# Patient Record
Sex: Male | Born: 1980 | Hispanic: Yes | Marital: Married | Smoking: Former smoker
Health system: Southern US, Community
[De-identification: ages and names within clinical notes are randomized; demographics above are authoritative.]

## PROBLEM LIST (undated history)

## (undated) DIAGNOSIS — E785 Hyperlipidemia, unspecified: Secondary | ICD-10-CM

## (undated) DIAGNOSIS — R011 Cardiac murmur, unspecified: Secondary | ICD-10-CM

## (undated) HISTORY — PX: APPENDECTOMY (OPEN): SHX54

## (undated) HISTORY — DX: Hyperlipidemia, unspecified: E78.5

## (undated) HISTORY — DX: Cardiac murmur, unspecified: R01.1

---

## 2013-05-03 ENCOUNTER — Inpatient Hospital Stay (HOSPITAL_COMMUNITY): Payer: Federal, State, Local not specified - PPO | Admitting: Anesthesiology

## 2013-05-03 ENCOUNTER — Emergency Department (HOSPITAL_COMMUNITY): Payer: Federal, State, Local not specified - PPO

## 2013-05-03 ENCOUNTER — Encounter (HOSPITAL_COMMUNITY): Payer: Self-pay | Admitting: Emergency Medicine

## 2013-05-03 ENCOUNTER — Encounter (HOSPITAL_COMMUNITY): Payer: Federal, State, Local not specified - PPO | Admitting: Anesthesiology

## 2013-05-03 ENCOUNTER — Inpatient Hospital Stay (HOSPITAL_COMMUNITY)
Admission: EM | Admit: 2013-05-03 | Discharge: 2013-05-05 | DRG: 343 | Disposition: A | Discharge status: Home / Self Care | Payer: Federal, State, Local not specified - PPO | Attending: General Surgery | Admitting: General Surgery

## 2013-05-03 ENCOUNTER — Encounter (HOSPITAL_COMMUNITY): Admission: EM | Disposition: A | Discharge status: Home / Self Care | Payer: Self-pay | Source: Home / Self Care

## 2013-05-03 DIAGNOSIS — K3580 Unspecified acute appendicitis: Secondary | ICD-10-CM

## 2013-05-03 DIAGNOSIS — K37 Unspecified appendicitis: Secondary | ICD-10-CM

## 2013-05-03 DIAGNOSIS — Z87891 Personal history of nicotine dependence: Secondary | ICD-10-CM

## 2013-05-03 HISTORY — PX: LAPAROSCOPIC APPENDECTOMY: SHX408

## 2013-05-03 LAB — URINALYSIS, ROUTINE W REFLEX MICROSCOPIC
BILIRUBIN URINE: NEGATIVE
Glucose, UA: NEGATIVE mg/dL
HGB URINE DIPSTICK: NEGATIVE
Ketones, ur: NEGATIVE mg/dL
Leukocytes, UA: NEGATIVE
Nitrite: NEGATIVE
Protein, ur: 30 mg/dL — AB
Specific Gravity, Urine: 1.028 (ref 1.005–1.030)
UROBILINOGEN UA: 0.2 mg/dL (ref 0.0–1.0)
pH: 8.5 — ABNORMAL HIGH (ref 5.0–8.0)

## 2013-05-03 LAB — COMPREHENSIVE METABOLIC PANEL
ALK PHOS: 63 U/L (ref 39–117)
ALT: 25 U/L (ref 0–53)
AST: 27 U/L (ref 0–37)
Albumin: 4.6 g/dL (ref 3.5–5.2)
BILIRUBIN TOTAL: 2.6 mg/dL — AB (ref 0.3–1.2)
BUN: 12 mg/dL (ref 6–23)
CHLORIDE: 100 meq/L (ref 96–112)
CO2: 25 meq/L (ref 19–32)
Calcium: 9.9 mg/dL (ref 8.4–10.5)
Creatinine, Ser: 0.77 mg/dL (ref 0.50–1.35)
GFR calc non Af Amer: >90 mL/min (ref >90)
GLUCOSE: 134 mg/dL — AB (ref 70–99)
Potassium: 3.8 mEq/L (ref 3.7–5.3)
Sodium: 139 mEq/L (ref 137–147)
Total Protein: 7.4 g/dL (ref 6.0–8.3)

## 2013-05-03 LAB — CBC WITH DIFFERENTIAL/PLATELET
Basophils Absolute: 0 10*3/uL (ref 0.0–0.1)
Basophils Relative: 0 % (ref 0–1)
EOS PCT: 0 % (ref 0–5)
Eosinophils Absolute: 0 10*3/uL (ref 0.0–0.7)
HCT: 45.1 % (ref 39.0–52.0)
Hemoglobin: 16.6 g/dL (ref 13.0–17.0)
LYMPHS ABS: 0.5 10*3/uL — AB (ref 0.7–4.0)
LYMPHS PCT: 3 % — AB (ref 12–46)
MCH: 31.3 pg (ref 26.0–34.0)
MCHC: 36.8 g/dL — ABNORMAL HIGH (ref 30.0–36.0)
MCV: 85.1 fL (ref 78.0–100.0)
Monocytes Absolute: 1.1 10*3/uL — ABNORMAL HIGH (ref 0.1–1.0)
Monocytes Relative: 6 % (ref 3–12)
NEUTROS PCT: 91 % — AB (ref 43–77)
Neutro Abs: 15.7 10*3/uL — ABNORMAL HIGH (ref 1.7–7.7)
Platelets: 203 10*3/uL (ref 150–400)
RBC: 5.3 MIL/uL (ref 4.22–5.81)
RDW: 12.1 % (ref 11.5–15.5)
WBC: 17.4 10*3/uL — AB (ref 4.0–10.5)

## 2013-05-03 LAB — LIPASE, BLOOD: Lipase: 25 U/L (ref 11–59)

## 2013-05-03 LAB — URINE MICROSCOPIC-ADD ON

## 2013-05-03 SURGERY — APPENDECTOMY, LAPAROSCOPIC
Anesthesia: General | Site: Abdomen

## 2013-05-03 MED ORDER — ONDANSETRON HCL 4 MG/2ML IJ SOLN: 4.0000 mg | Freq: Four times a day (QID) | INTRAMUSCULAR | Status: DC | PRN

## 2013-05-03 MED ORDER — FENTANYL CITRATE 0.05 MG/ML IJ SOLN
INTRAMUSCULAR | Status: AC
  Filled 2013-05-03: qty 5

## 2013-05-03 MED ORDER — HYDROMORPHONE HCL PF 1 MG/ML IJ SOLN: 0.2500 mg | INTRAMUSCULAR | Status: DC | PRN

## 2013-05-03 MED ORDER — ARTIFICIAL TEARS OP OINT
TOPICAL_OINTMENT | OPHTHALMIC | Status: DC | PRN
  Administered 2013-05-03: 1 via OPHTHALMIC

## 2013-05-03 MED ORDER — ROCURONIUM BROMIDE 50 MG/5ML IV SOLN
INTRAVENOUS | Status: AC
  Filled 2013-05-03: qty 1

## 2013-05-03 MED ORDER — SUCCINYLCHOLINE CHLORIDE 20 MG/ML IJ SOLN
INTRAMUSCULAR | Status: DC | PRN
Start: 2013-05-03 — End: 2013-05-03
  Administered 2013-05-03: 100 mg via INTRAVENOUS

## 2013-05-03 MED ORDER — KCL IN DEXTROSE-NACL 20-5-0.45 MEQ/L-%-% IV SOLN: INTRAVENOUS | Status: DC

## 2013-05-03 MED ORDER — PIPERACILLIN-TAZOBACTAM 3.375 G IVPB
3.3750 g | Freq: Three times a day (TID) | INTRAVENOUS | Status: AC
  Administered 2013-05-03 – 2013-05-04 (×2): 3.375 g via INTRAVENOUS
  Filled 2013-05-03 (×2): qty 50

## 2013-05-03 MED ORDER — PROPOFOL 10 MG/ML IV BOLUS
INTRAVENOUS | Status: AC
  Filled 2013-05-03: qty 20

## 2013-05-03 MED ORDER — PIPERACILLIN-TAZOBACTAM 3.375 G IVPB 30 MIN
3.3750 g | Freq: Once | INTRAVENOUS | Status: AC
  Administered 2013-05-03: 3.375 g via INTRAVENOUS
  Filled 2013-05-03: qty 50

## 2013-05-03 MED ORDER — FENTANYL CITRATE 0.05 MG/ML IJ SOLN
INTRAMUSCULAR | Status: DC | PRN
  Administered 2013-05-03: 50 ug via INTRAVENOUS
  Administered 2013-05-03: 100 ug via INTRAVENOUS
  Administered 2013-05-03 (×2): 50 ug via INTRAVENOUS

## 2013-05-03 MED ORDER — INFLUENZA VAC SPLIT QUAD 0.5 ML IM SUSP: 0.5000 mL | INTRAMUSCULAR | Status: DC | PRN

## 2013-05-03 MED ORDER — NEOSTIGMINE METHYLSULFATE 1 MG/ML IJ SOLN
INTRAMUSCULAR | Status: DC | PRN
  Administered 2013-05-03: 3 mg via INTRAVENOUS

## 2013-05-03 MED ORDER — MORPHINE SULFATE 4 MG/ML IJ SOLN
4.0000 mg | Freq: Once | INTRAMUSCULAR | Status: AC
  Administered 2013-05-03: 4 mg via INTRAVENOUS
  Filled 2013-05-03: qty 1

## 2013-05-03 MED ORDER — SODIUM CHLORIDE 0.9 % IR SOLN
Status: DC | PRN
Start: 2013-05-03 — End: 2013-05-03
  Administered 2013-05-03: 1000 mL

## 2013-05-03 MED ORDER — MORPHINE SULFATE 2 MG/ML IJ SOLN
2.0000 mg | INTRAMUSCULAR | Status: DC | PRN
  Administered 2013-05-04: 2 mg via INTRAVENOUS
  Filled 2013-05-03: qty 1

## 2013-05-03 MED ORDER — OXYCODONE HCL 5 MG/5ML PO SOLN: 5.0000 mg | Freq: Once | ORAL | Status: DC | PRN

## 2013-05-03 MED ORDER — KCL IN DEXTROSE-NACL 20-5-0.45 MEQ/L-%-% IV SOLN
INTRAVENOUS | Status: AC
  Filled 2013-05-03: qty 1000

## 2013-05-03 MED ORDER — DIPHENHYDRAMINE HCL 12.5 MG/5ML PO ELIX: 12.5000 mg | ORAL_SOLUTION | Freq: Four times a day (QID) | ORAL | Status: DC | PRN

## 2013-05-03 MED ORDER — LACTATED RINGERS IV SOLN
INTRAVENOUS | Status: DC
  Administered 2013-05-03: 17:00:00 via INTRAVENOUS

## 2013-05-03 MED ORDER — LACTATED RINGERS IV SOLN
INTRAVENOUS | Status: DC | PRN
  Administered 2013-05-03 (×2): via INTRAVENOUS

## 2013-05-03 MED ORDER — MIDAZOLAM HCL 2 MG/2ML IJ SOLN
INTRAMUSCULAR | Status: AC
  Filled 2013-05-03: qty 2

## 2013-05-03 MED ORDER — ONDANSETRON HCL 4 MG/2ML IJ SOLN
4.0000 mg | Freq: Once | INTRAMUSCULAR | Status: AC
  Administered 2013-05-03: 4 mg via INTRAVENOUS
  Filled 2013-05-03: qty 2

## 2013-05-03 MED ORDER — ARTIFICIAL TEARS OP OINT
TOPICAL_OINTMENT | OPHTHALMIC | Status: AC
  Filled 2013-05-03: qty 3.5

## 2013-05-03 MED ORDER — PROPOFOL 10 MG/ML IV BOLUS
INTRAVENOUS | Status: DC | PRN
  Administered 2013-05-03: 200 mg via INTRAVENOUS

## 2013-05-03 MED ORDER — BUPIVACAINE-EPINEPHRINE (PF) 0.5% -1:200000 IJ SOLN
INTRAMUSCULAR | Status: AC
  Filled 2013-05-03: qty 10

## 2013-05-03 MED ORDER — NEOSTIGMINE METHYLSULFATE 1 MG/ML IJ SOLN
INTRAMUSCULAR | Status: AC
  Filled 2013-05-03: qty 10

## 2013-05-03 MED ORDER — MORPHINE SULFATE 2 MG/ML IJ SOLN: 2.0000 mg | INTRAMUSCULAR | Status: DC | PRN

## 2013-05-03 MED ORDER — LIDOCAINE HCL (CARDIAC) 20 MG/ML IV SOLN
INTRAVENOUS | Status: DC | PRN
  Administered 2013-05-03: 80 mg via INTRAVENOUS

## 2013-05-03 MED ORDER — ONDANSETRON HCL 4 MG/2ML IJ SOLN
INTRAMUSCULAR | Status: DC | PRN
  Administered 2013-05-03: 4 mg via INTRAVENOUS

## 2013-05-03 MED ORDER — KCL IN DEXTROSE-NACL 20-5-0.45 MEQ/L-%-% IV SOLN
INTRAVENOUS | Status: DC
  Administered 2013-05-03: 22:00:00 via INTRAVENOUS
  Administered 2013-05-03: 100 mL via INTRAVENOUS
  Filled 2013-05-03 (×3): qty 1000

## 2013-05-03 MED ORDER — ROCURONIUM BROMIDE 100 MG/10ML IV SOLN
INTRAVENOUS | Status: DC | PRN
  Administered 2013-05-03: 50 mg via INTRAVENOUS

## 2013-05-03 MED ORDER — IOHEXOL 300 MG/ML  SOLN
100.0000 mL | Freq: Once | INTRAMUSCULAR | Status: AC | PRN
  Administered 2013-05-03: 100 mL via INTRAVENOUS

## 2013-05-03 MED ORDER — PIPERACILLIN-TAZOBACTAM 3.375 G IVPB
3.3750 g | Freq: Three times a day (TID) | INTRAVENOUS | Status: DC
  Filled 2013-05-03 (×2): qty 50

## 2013-05-03 MED ORDER — LIDOCAINE HCL (CARDIAC) 20 MG/ML IV SOLN
INTRAVENOUS | Status: AC
  Filled 2013-05-03: qty 5

## 2013-05-03 MED ORDER — OXYCODONE-ACETAMINOPHEN 5-325 MG PO TABS
1.0000 | ORAL_TABLET | ORAL | Status: DC | PRN
  Administered 2013-05-04 – 2013-05-05 (×3): 2 via ORAL
  Filled 2013-05-03 (×3): qty 2

## 2013-05-03 MED ORDER — IOHEXOL 300 MG/ML  SOLN
25.0000 mL | INTRAMUSCULAR | Status: DC | PRN
  Administered 2013-05-03: 25 mL via ORAL

## 2013-05-03 MED ORDER — GLYCOPYRROLATE 0.2 MG/ML IJ SOLN
INTRAMUSCULAR | Status: DC | PRN
  Administered 2013-05-03: 0.4 mg via INTRAVENOUS

## 2013-05-03 MED ORDER — MIDAZOLAM HCL 5 MG/5ML IJ SOLN
INTRAMUSCULAR | Status: DC | PRN
  Administered 2013-05-03: 2 mg via INTRAVENOUS

## 2013-05-03 MED ORDER — GLYCOPYRROLATE 0.2 MG/ML IJ SOLN
INTRAMUSCULAR | Status: AC
  Filled 2013-05-03: qty 3

## 2013-05-03 MED ORDER — OXYCODONE HCL 5 MG PO TABS: 5.0000 mg | ORAL_TABLET | Freq: Once | ORAL | Status: DC | PRN

## 2013-05-03 MED ORDER — BUPIVACAINE-EPINEPHRINE 0.5% -1:200000 IJ SOLN
INTRAMUSCULAR | Status: DC | PRN
  Administered 2013-05-03: 21 mL

## 2013-05-03 MED ORDER — ONDANSETRON HCL 4 MG/2ML IJ SOLN
INTRAMUSCULAR | Status: AC
  Filled 2013-05-03: qty 2

## 2013-05-03 MED ORDER — DIPHENHYDRAMINE HCL 50 MG/ML IJ SOLN: 12.5000 mg | Freq: Four times a day (QID) | INTRAMUSCULAR | Status: DC | PRN

## 2013-05-03 SURGICAL SUPPLY — 38 items
APPLIER CLIP ROT 10 11.4 M/L (STAPLE) ×3
BLADE SURG ROTATE 9660 (MISCELLANEOUS) IMPLANT
CANISTER SUCTION 2500CC (MISCELLANEOUS) ×3 IMPLANT
CHLORAPREP W/TINT 26ML (MISCELLANEOUS) ×3 IMPLANT
CLIP APPLIE ROT 10 11.4 M/L (STAPLE) ×1 IMPLANT
COVER SURGICAL LIGHT HANDLE (MISCELLANEOUS) ×3 IMPLANT
CUTTER FLEX LINEAR 45M (STAPLE) ×3 IMPLANT
DERMABOND ADVANCED (GAUZE/BANDAGES/DRESSINGS) ×2
DERMABOND ADVANCED .7 DNX12 (GAUZE/BANDAGES/DRESSINGS) ×1 IMPLANT
DRAPE UTILITY 15X26 W/TAPE STR (DRAPE) ×6 IMPLANT
ELECT REM PT RETURN 9FT ADLT (ELECTROSURGICAL) ×3
ELECTRODE REM PT RTRN 9FT ADLT (ELECTROSURGICAL) ×1 IMPLANT
ENDOLOOP SUT PDS II  0 18 (SUTURE)
ENDOLOOP SUT PDS II 0 18 (SUTURE) IMPLANT
GLOVE BIOGEL PI IND STRL 8 (GLOVE) ×1 IMPLANT
GLOVE BIOGEL PI INDICATOR 8 (GLOVE) ×2
GLOVE SS BIOGEL STRL SZ 7.5 (GLOVE) ×1 IMPLANT
GLOVE SUPERSENSE BIOGEL SZ 7.5 (GLOVE) ×2
GOWN STRL NON-REIN LRG LVL3 (GOWN DISPOSABLE) ×6 IMPLANT
GOWN STRL REIN XL XLG (GOWN DISPOSABLE) ×3 IMPLANT
KIT BASIN OR (CUSTOM PROCEDURE TRAY) ×3 IMPLANT
KIT ROOM TURNOVER OR (KITS) ×3 IMPLANT
NS IRRIG 1000ML POUR BTL (IV SOLUTION) ×3 IMPLANT
PAD ARMBOARD 7.5X6 YLW CONV (MISCELLANEOUS) ×6 IMPLANT
POUCH SPECIMEN RETRIEVAL 10MM (ENDOMECHANICALS) ×3 IMPLANT
RELOAD STAPLE TA45 3.5 REG BLU (ENDOMECHANICALS) ×3 IMPLANT
SCALPEL HARMONIC ACE (MISCELLANEOUS) ×3 IMPLANT
SET IRRIG TUBING LAPAROSCOPIC (IRRIGATION / IRRIGATOR) ×3 IMPLANT
SUT MON AB 5-0 P3 18 (SUTURE) ×3 IMPLANT
TOWEL OR 17X24 6PK STRL BLUE (TOWEL DISPOSABLE) ×3 IMPLANT
TOWEL OR 17X26 10 PK STRL BLUE (TOWEL DISPOSABLE) ×3 IMPLANT
TRAY FOLEY CATH 16FR SILVER (SET/KITS/TRAYS/PACK) ×3 IMPLANT
TRAY LAPAROSCOPIC (CUSTOM PROCEDURE TRAY) ×3 IMPLANT
TROCAR KII 12MM C0R29 THR SEP (TROCAR) ×3 IMPLANT
TROCAR XCEL 12X100 BLDLESS (ENDOMECHANICALS) ×3 IMPLANT
TROCAR XCEL BLUNT TIP 100MML (ENDOMECHANICALS) ×3 IMPLANT
TROCAR XCEL NON-BLD 5MMX100MML (ENDOMECHANICALS) ×3 IMPLANT
WATER STERILE IRR 1000ML POUR (IV SOLUTION) IMPLANT

## 2013-05-03 NOTE — ED Notes (Signed)
Patient transported to CT 

## 2013-05-03 NOTE — ED Provider Notes (Signed)
CSN: [id number]     Arrival date & time 05/03/13  1205 History   First MD Initiated Contact with Patient 05/03/13 1223     Chief Complaint  Patient presents with  . Abdominal Pain  . Emesis     (Consider location/radiation/quality/duration/timing/severity/associated sxs/prior Treatment) HPI Comments: Patient is a 33 year old otherwise healthy male who presents today with one day of abdominal pain. He reports that his pain began generally over his abdomen and has moved into his right lower quadrant. He did not have an appetite yesterday and only ate oatmeal. He has not eaten anything today. He began to have nausea and vomiting today. He denies any fever. No diarrhea. He's never had any abdominal surgeries in the past. No chest pain shortness of breath, dysuria, urinary urgency, urinary frequency. Patient is currently in the process of moving from [place] to [place] and stayed the night in [place].   Patient is a 33 y.o. male presenting with abdominal pain and vomiting. The history is provided by the patient. No language interpreter was used.  Abdominal Pain Associated symptoms: nausea and vomiting   Associated symptoms: no chest pain, no chills, no diarrhea, no fever and no shortness of breath   Emesis Associated symptoms: abdominal pain   Associated symptoms: no chills and no diarrhea     History reviewed. No pertinent past medical history. History reviewed. No pertinent past surgical history. No family history on file. History  Substance Use Topics  . Smoking status: Former Smoker    Quit date: 05/04/2010  . Smokeless tobacco: Not on file  . Alcohol Use: Yes    Review of Systems  Constitutional: Positive for appetite change. Negative for fever and chills.  Respiratory: Negative for shortness of breath.   Cardiovascular: Negative for chest pain.  Gastrointestinal: Positive for nausea, vomiting and abdominal pain. Negative for diarrhea.  All other systems reviewed and are  negative.      Allergies  Review of patient's allergies indicates no known allergies.  Home Medications  No current outpatient prescriptions on file. BP 119/65  Pulse 76  Temp(Src) 97.5 F (36.4 C) (Oral)  Resp 18  Wt 201 lb 3.2 oz (91.264 kg)  SpO2 99% Physical Exam  Nursing note and vitals reviewed. Constitutional: He is oriented to person, place, and time. He appears well-developed and well-nourished. No distress.  HENT:  Head: Normocephalic and atraumatic.  Right Ear: External ear normal.  Left Ear: External ear normal.  Nose: Nose normal.  Eyes: Conjunctivae are normal.  Neck: Normal range of motion. No tracheal deviation present.  Cardiovascular: Normal rate, regular rhythm and normal heart sounds.   Pulmonary/Chest: Effort normal and breath sounds normal. No stridor.  Abdominal: Soft. He exhibits no distension. There is tenderness in the right lower quadrant. There is tenderness at McBurney's point. There is no rigidity, no rebound and no guarding.  Musculoskeletal: Normal range of motion.  Neurological: He is alert and oriented to person, place, and time.  Skin: Skin is warm and dry. He is not diaphoretic.  Psychiatric: He has a normal mood and affect. His behavior is normal.    ED Course  Procedures (including critical care time) Labs Review Labs Reviewed  CBC WITH DIFFERENTIAL - Abnormal; Notable for the following:    WBC 17.4 (*)    MCHC 36.8 (*)    Neutrophils Relative % 91 (*)    Neutro Abs 15.7 (*)    Lymphocytes Relative 3 (*)    Lymphs Abs 0.5 (*)  Monocytes Absolute 1.1 (*)    All other components within normal limits  COMPREHENSIVE METABOLIC PANEL - Abnormal; Notable for the following:    Glucose, Bld 134 (*)    Total Bilirubin 2.6 (*)    All other components within normal limits  URINALYSIS, ROUTINE W REFLEX MICROSCOPIC - Abnormal; Notable for the following:    pH 8.5 (*)    Protein, ur 30 (*)    All other components within normal limits   LIPASE, BLOOD  URINE MICROSCOPIC-ADD ON   Imaging Review Ct Abdomen Pelvis W Contrast  05/03/2013   CLINICAL DATA:  Right lower quadrant pain.  Vomiting.  EXAM: CT ABDOMEN AND PELVIS WITH CONTRAST  TECHNIQUE: Multidetector CT imaging of the abdomen and pelvis was performed using the standard protocol following bolus administration of intravenous contrast.  CONTRAST:  100 mL OMNIPAQUE IOHEXOL 300 MG/ML  SOLN  COMPARISON:  None.  FINDINGS: The lung bases are clear.  No pleural or pericardial effusion.  The gallbladder, liver, spleen, adrenal glands, kidneys, pancreas and biliary appear normal. There is an appendicolith at the appendiceal orifice. The appendix is dilated at 1.3 cm. Tiny amount of free pelvic fluid is present. The stomach, small bowel and colon appear normal. There is no lymphadenopathy. No focal bony abnormality is identified.  IMPRESSION: Study is positive for acute appendicitis without abscess or perforation.  Critical Value/emergent results were called by telephone at the time of interpretation on 05/03/2013 at 2:23 PM to [facility]A., who verbally acknowledged these results.   Electronically Signed   By: Drusilla Kannerhomas  Dalessio M.D.   On: 05/03/2013 14:25     EKG Interpretation None      MDM   Final diagnoses:  Appendicitis    Patient is a very pleasant 33 year old male who presents to ED with acute appendicitis. Abd is tender in rlq, no rebound. Discussed case with Will from surgery who will be down to evaluate patient. Zosyn was started. Vital signs stable. Pain is controlled with morphine. Plan for patient to go to surgery. Patient / Family / Caregiver informed of clinical course, understand medical decision-making process, and agree with plan.   Mora BellmanHannah S Sherill Mangen, PA-C 05/03/13 1517

## 2013-05-03 NOTE — H&P (Signed)
Bradley Harmon is an 33 y.o. male.   Chief Complaint: abdominal pain, nausea and vomiting. HPI: Healthy 33 y/o male driving to [place]. From [place].  He stopped and awoke about 3AM today with abdominal pain, then nausea and vomiting.  He could not go further and came to the ER here at [facility].  Work up shows an elevated WBC, and CT scan show:  The appendix is dilated at 1.3 cm. Tiny amount of free pelvic fluid is present. The stomach, small bowel and colon appear normal. There is no lymphadenopathy. No focal bony abnormality is identified. This is considered positive for acute appendicitis. We are ask to see.  He has pain in the RLQ, only relieved by morphine.  He works for [employer], and has never been sick before. His wife is flying in to meet him as soon as possible.  History reviewed. No pertinent past medical history. None History reviewed. No pertinent past surgical history. None No family history on file.Mother deceased with stomach cancer about 3 years ago, Father unknown, 1/2 brothers all in good health. Social History:  reports that he quit smoking about 3 years ago. He does not have any smokeless tobacco history on file. He reports that he drinks alcohol. He reports that he does not use illicit drugs.  Allergies: No Known Allergies  Prior to Admission medications   None     Results for orders placed during the hospital encounter of 05/03/13 (from the past 48 hour(s))  CBC WITH DIFFERENTIAL     Status: Abnormal   Collection Time    05/03/13 12:15 PM      Result Value Ref Range   WBC 17.4 (*) 4.0 - 10.5 K/uL   RBC 5.30  4.22 - 5.81 MIL/uL   Hemoglobin 16.6  13.0 - 17.0 g/dL   HCT 45.1  39.0 - 52.0 %   MCV 85.1  78.0 - 100.0 fL   MCH 31.3  26.0 - 34.0 pg   MCHC 36.8 (*) 30.0 - 36.0 g/dL   RDW 12.1  11.5 - 15.5 %   Platelets 203  150 - 400 K/uL   Neutrophils Relative % 91 (*) 43 - 77 %   Neutro Abs 15.7 (*) 1.7 - 7.7 K/uL   Lymphocytes Relative 3 (*) 12 - 46 %    Lymphs Abs 0.5 (*) 0.7 - 4.0 K/uL   Monocytes Relative 6  3 - 12 %   Monocytes Absolute 1.1 (*) 0.1 - 1.0 K/uL   Eosinophils Relative 0  0 - 5 %   Eosinophils Absolute 0.0  0.0 - 0.7 K/uL   Basophils Relative 0  0 - 1 %   Basophils Absolute 0.0  0.0 - 0.1 K/uL  COMPREHENSIVE METABOLIC PANEL     Status: Abnormal   Collection Time    05/03/13 12:15 PM      Result Value Ref Range   Sodium 139  137 - 147 mEq/L   Potassium 3.8  3.7 - 5.3 mEq/L   Chloride 100  96 - 112 mEq/L   CO2 25  19 - 32 mEq/L   Glucose, Bld 134 (*) 70 - 99 mg/dL   BUN 12  6 - 23 mg/dL   Creatinine, Ser 0.77  0.50 - 1.35 mg/dL   Calcium 9.9  8.4 - 10.5 mg/dL   Total Protein 7.4  6.0 - 8.3 g/dL   Albumin 4.6  3.5 - 5.2 g/dL   AST 27  0 - 37 U/L  ALT 25  0 - 53 U/L   Alkaline Phosphatase 63  39 - 117 U/L   Total Bilirubin 2.6 (*) 0.3 - 1.2 mg/dL   GFR calc non Af Amer >90  >90 mL/min   GFR calc Af Amer >90  >90 mL/min   Comment: (NOTE)     The eGFR has been calculated using the CKD EPI equation.     This calculation has not been validated in all clinical situations.     eGFR's persistently <90 mL/min signify possible Chronic Kidney     Disease.  LIPASE, BLOOD     Status: None   Collection Time    05/03/13 12:15 PM      Result Value Ref Range   Lipase 25  11 - 59 U/L  URINALYSIS, ROUTINE W REFLEX MICROSCOPIC     Status: Abnormal   Collection Time    05/03/13  2:07 PM      Result Value Ref Range   Color, Urine YELLOW  YELLOW   APPearance CLEAR  CLEAR   Specific Gravity, Urine 1.028  1.005 - 1.030   pH 8.5 (*) 5.0 - 8.0   Glucose, UA NEGATIVE  NEGATIVE mg/dL   Hgb urine dipstick NEGATIVE  NEGATIVE   Bilirubin Urine NEGATIVE  NEGATIVE   Ketones, ur NEGATIVE  NEGATIVE mg/dL   Protein, ur 30 (*) NEGATIVE mg/dL   Urobilinogen, UA 0.2  0.0 - 1.0 mg/dL   Nitrite NEGATIVE  NEGATIVE   Leukocytes, UA NEGATIVE  NEGATIVE  URINE MICROSCOPIC-ADD ON     Status: None   Collection Time    05/03/13  2:07 PM       Result Value Ref Range   Squamous Epithelial / LPF RARE  RARE   Ct Abdomen Pelvis W Contrast  05/03/2013   CLINICAL DATA:  Right lower quadrant pain.  Vomiting.  EXAM: CT ABDOMEN AND PELVIS WITH CONTRAST  TECHNIQUE: Multidetector CT imaging of the abdomen and pelvis was performed using the standard protocol following bolus administration of intravenous contrast.  CONTRAST:  100 mL OMNIPAQUE IOHEXOL 300 MG/ML  SOLN  COMPARISON:  None.  FINDINGS: The lung bases are clear.  No pleural or pericardial effusion.  The gallbladder, liver, spleen, adrenal glands, kidneys, pancreas and biliary appear normal. There is an appendicolith at the appendiceal orifice. The appendix is dilated at 1.3 cm. Tiny amount of free pelvic fluid is present. The stomach, small bowel and colon appear normal. There is no lymphadenopathy. No focal bony abnormality is identified.  IMPRESSION: Study is positive for acute appendicitis without abscess or perforation.  Critical Value/emergent results were called by telephone at the time of interpretation on 05/03/2013 at 2:23 PM to [facility], [facility] who verbally acknowledged these results.   Electronically Signed   By: Inge Rise M.D.   On: 05/03/2013 14:25    Review of Systems  Constitutional: Positive for chills. Negative for fever, weight loss, malaise/fatigue and diaphoresis.  HENT: Negative.   Eyes: Negative.   Respiratory: Negative.   Cardiovascular: Negative.   Gastrointestinal: Positive for nausea, vomiting and abdominal pain. Negative for heartburn, diarrhea, constipation, blood in stool and melena.  Genitourinary: Negative.   Musculoskeletal: Negative.   Skin: Negative.   Neurological: Negative.  Negative for weakness.  Endo/Heme/Allergies: Negative.   Psychiatric/Behavioral: Negative.     Blood pressure 118/67, pulse 81, temperature 97.5 F (36.4 C), temperature source Oral, resp. rate 18, weight 91.264 kg (201 lb 3.2 oz), SpO2 99.00%. Physical Exam   Constitutional:  He is oriented to person, place, and time. He appears well-developed and well-nourished.  He feels febrile now  HENT:  Head: Normocephalic and atraumatic.  Nose: Nose normal.  Eyes: Conjunctivae and EOM are normal. Pupils are equal, round, and reactive to light. Right eye exhibits no discharge. Left eye exhibits no discharge. No scleral icterus.  Neck: Normal range of motion. Neck supple. No JVD present. No tracheal deviation present. No thyromegaly present.  Cardiovascular: Normal rate, regular rhythm, normal heart sounds and intact distal pulses.  Exam reveals no gallop and no friction rub.   No murmur heard. Respiratory: Effort normal and breath sounds normal. No respiratory distress. He has no wheezes. He has no rales. He exhibits no tenderness.  GI: Soft. Bowel sounds are normal. He exhibits no distension and no mass. There is tenderness (Pain RLQ). There is no rebound and no guarding.  Musculoskeletal: He exhibits no edema and no tenderness.  Lymphadenopathy:    He has no cervical adenopathy.  Neurological: He is alert and oriented to person, place, and time. No cranial nerve deficit.  Skin: Skin is warm and dry. No rash noted. No erythema. No pallor.  Psychiatric: He has a normal mood and affect. Judgment and thought content normal.     Assessment/Plan 1.  Acute appendicitis  Plan:  To or ASAP  Ganesh Deeg 05/03/2013, 4:04 PM

## 2013-05-03 NOTE — Op Note (Signed)
Preoperative Diagnosis: Appendicitis [541]  Postoprative Diagnosis: Appendicitis [541]  Procedure: Procedure(s): APPENDECTOMY LAPAROSCOPIC   Surgeon: Glenna FellowsHoxworth, Warren Lindahl T   Assistants: none  Anesthesia:  General endotracheal anesthesia  Indications: patient is a generally healthy 33 year old male who presents with 24 hours of typical symptoms of acute appendicitis with localized right lower quadrant tenderness. A CT scan was obtained by the emergency department confirming acute appendicitis with an appendicolith and no complicating factors. We have recommended proceeding with laparoscopic and possible open appendectomy. I discussed the indications for the procedure as well as risks of anesthetic complications, bleeding, infection, visceral injury. He understands and agrees to proceed.  Procedure Detail:  Patient was brought to the operating room, placed in the supine position on the operating table and general endotracheal anesthesia induced. Foley catheter was placed. He was given broad-spectrum IV antibiotics. PAS were in place. Patient timeout was performed and correct procedure verified. Local anesthesia was used to infiltrate the trocar sites. A 1 cm incision was made at the umbilicus and dissection carried down to midline fascia which was incised for 1 cm and through mattress suture of 0 Vicryl the assault trocar was placed and pneumoperitoneum established. Under direct vision a 5 mm trocar was placed in the right upper quadrant and a 12 mm trocar in the left lower quadrant. The appendix was easily visualized anteriorly and was acutely inflamed with early gangrenous changes but no perforation. The appendix was elevated its lateral peritoneal attachments divided with the harmonic scalpel mobilizing the appendix and tip of the cecum. The base of the appendix was relatively free of inflammation. The mesial appendix was sequentially divided with the harmonic scalpel until the appendix was completely  free down to the tip of the cecum. The appendix was then divided across its base at the tip of the cecum with a single firing of the Endo GIA 45 mm blue load stapler. The staple line was intact and without bleeding. The appendix was placed in an Endo Catch bag and brought out through the umbilical incision and the mattress suture secured at the umbilicus. The right lower quadrant with your gated and there was no evidence of bleeding or injury or other problems. Trochars were removed and all CO2 evacuated. Skin incisions were closed with subcuticular 4-0 Monocryl and I did place a mattress suture of 0 Vicryl in the anterior fascia at the left lower quadrant trocar site. Sponge needle and instrument counts were correct.    Findings: Acute appendicitis with early gangrene but no perforation or abscess  Estimated Blood Loss:  Minimal         Drains: none  Blood Given: none          Specimens: appendix        Complications:  * No complications entered in OR log *         Disposition: PACU - hemodynamically stable.         Condition: stable

## 2013-05-03 NOTE — Anesthesia Preprocedure Evaluation (Signed)
Anesthesia Evaluation  Patient identified by MRN, date of birth, ID band Patient awake    Reviewed: Allergy & Precautions, H&P , NPO status , Patient's Chart, lab work & pertinent test results  Airway Mallampati: II  Neck ROM: full    Dental   Pulmonary former smoker,          Cardiovascular negative cardio ROS      Neuro/Psych    GI/Hepatic Acute appendicitis   Endo/Other    Renal/GU      Musculoskeletal   Abdominal   Peds  Hematology   Anesthesia Other Findings   Reproductive/Obstetrics                           Anesthesia Physical Anesthesia Plan  ASA: I  Anesthesia Plan: General   Post-op Pain Management:    Induction: Intravenous, Rapid sequence and Cricoid pressure planned  Airway Management Planned: Oral ETT  Additional Equipment:   Intra-op Plan:   Post-operative Plan: Extubation in OR  Informed Consent: I have reviewed the patients History and Physical, chart, labs and discussed the procedure including the risks, benefits and alternatives for the proposed anesthesia with the patient or authorized representative who has indicated his/her understanding and acceptance.     Plan Discussed with: CRNA, Anesthesiologist and Surgeon  Anesthesia Plan Comments:         Anesthesia Quick Evaluation

## 2013-05-03 NOTE — Transfer of Care (Signed)
Immediate Anesthesia Transfer of Care Note  Patient: Bradley Harmon  Procedure(s) Performed: Procedure(s): APPENDECTOMY LAPAROSCOPIC (N/A)  Patient Location: PACU  Anesthesia Type:General  Level of Consciousness: awake, alert , oriented and patient cooperative  Airway & Oxygen Therapy: Patient Spontanous Breathing  Post-op Assessment: Report given to PACU RN, Post -op Vital signs reviewed and stable and Patient moving all extremities X 4  Post vital signs: Reviewed and stable  Complications: No apparent anesthesia complications

## 2013-05-03 NOTE — ED Notes (Signed)
Patient taken to OR. Clothes sent with patient. Valuables given to security. Patient stable and taken up in the wheelchair. Key and security deposit taken up to patient.

## 2013-05-03 NOTE — Anesthesia Procedure Notes (Signed)
Procedure Name: Intubation Date/Time: 05/03/2013 5:04 PM Performed by: Angelica PouSMITH, Guilherme Schwenke PIZZICARA Pre-anesthesia Checklist: Patient identified, Patient being monitored, Emergency Drugs available, Timeout performed and Suction available Patient Re-evaluated:Patient Re-evaluated prior to inductionOxygen Delivery Method: Circle system utilized Preoxygenation: Pre-oxygenation with 100% oxygen Intubation Type: IV induction, Rapid sequence and Cricoid Pressure applied Laryngoscope Size: Mac and 4 Grade View: Grade I Tube type: Oral Tube size: 7.5 mm Number of attempts: 1 Airway Equipment and Method: Stylet Placement Confirmation: ETT inserted through vocal cords under direct vision,  breath sounds checked- equal and bilateral and positive ETCO2 Secured at: 23 cm Tube secured with: Tape Dental Injury: Teeth and Oropharynx as per pre-operative assessment

## 2013-05-03 NOTE — Preoperative (Signed)
Beta Blockers   Reason not to administer Beta Blockers:Not Applicable 

## 2013-05-03 NOTE — ED Notes (Signed)
Returned from ct 

## 2013-05-03 NOTE — ED Notes (Signed)
Pt c/o RLQ pain with emesis starting last night.  Pt describes pain as sharp and 10/10.

## 2013-05-03 NOTE — H&P (Signed)
Patient interviewed and examined, agree with PA note above.  Orlinda Slomski T Ashunti Schofield MD, FACS  05/03/2013 7:57 PM  

## 2013-05-04 ENCOUNTER — Encounter (HOSPITAL_COMMUNITY): Payer: Self-pay | Admitting: General Surgery

## 2013-05-04 MED ORDER — ENOXAPARIN SODIUM 40 MG/0.4ML ~~LOC~~ SOLN
40.0000 mg | SUBCUTANEOUS | Status: DC
  Administered 2013-05-04 – 2013-05-05 (×2): 40 mg via SUBCUTANEOUS
  Filled 2013-05-04 (×2): qty 0.4

## 2013-05-04 NOTE — Anesthesia Postprocedure Evaluation (Signed)
  Anesthesia Post-op Note  Patient: Eduard ClosAlfredo Mullens  Procedure(s) Performed: Procedure(s): APPENDECTOMY LAPAROSCOPIC (N/A)  Patient Location: Nursing Unit  Anesthesia Type:General  Level of Consciousness: awake, alert  and oriented  Airway and Oxygen Therapy: Patient Spontanous Breathing  Post-op Pain: none  Post-op Assessment: Post-op Vital signs reviewed, Patient's Cardiovascular Status Stable, Respiratory Function Stable, Patent Airway, No signs of Nausea or vomiting, Adequate PO intake and Pain level controlled  Post-op Vital Signs: Reviewed and stable  Complications: No apparent anesthesia complications

## 2013-05-04 NOTE — Progress Notes (Signed)
1 Day Post-Op  Subjective: Pt doing well, c/o abdominal pain and bloody drainage from his umbilical wound.  No N/V, tolerating some regular food, but afraid to eat to much.  Ambulating some.  Urinating well, no BM yet.  Wife flying in to [place] tomorrow and [place] Saturday.  He plans on going to a hotel via taxi tomorrow at discharge.  Objective: Vital signs in last 24 hours: Temp:  [97.5 F (36.4 C)-101.1 F (38.4 C)] 98.6 F (37 C) (03/05 0639) Pulse Rate:  [64-108] 70 (03/05 0639) Resp:  [0-22] 18 (03/05 0639) BP: (99-142)/(62-77) 99/65 mmHg (03/05 0639) SpO2:  [95 %-100 %] 98 % (03/05 0639) Weight:  [197 lb 12.8 oz (89.721 kg)-201 lb 3.2 oz (91.264 kg)] 197 lb 12.8 oz (89.721 kg) (03/04 1955) Last BM Date: 05/03/13  Intake/Output from previous day: 03/04 0701 - 03/05 0700 In: 2627 [P.O.:120; I.V.:2457; IV Piggyback:50] Out: 700 [Urine:600] Intake/Output this shift:    PE: Gen:  Alert, NAD, pleasant Abd: Soft, mild tenderness, ND, +BS, no HSM, incisions C/D/I, umbilicus with sanguinous drainage with gauze overtop   Lab Results:   Recent Labs  05/03/13 1215  WBC 17.4*  HGB 16.6  HCT 45.1  PLT 203   BMET  Recent Labs  05/03/13 1215  NA 139  K 3.8  CL 100  CO2 25  GLUCOSE 134*  BUN 12  CREATININE 0.77  CALCIUM 9.9   PT/INR No results found for this basename: LABPROT, INR,  in the last 72 hours CMP     Component Value Date/Time   NA 139 05/03/2013 1215   K 3.8 05/03/2013 1215   CL 100 05/03/2013 1215   CO2 25 05/03/2013 1215   GLUCOSE 134* 05/03/2013 1215   BUN 12 05/03/2013 1215   CREATININE 0.77 05/03/2013 1215   CALCIUM 9.9 05/03/2013 1215   PROT 7.4 05/03/2013 1215   ALBUMIN 4.6 05/03/2013 1215   AST 27 05/03/2013 1215   ALT 25 05/03/2013 1215   ALKPHOS 63 05/03/2013 1215   BILITOT 2.6* 05/03/2013 1215   GFRNONAA >90 05/03/2013 1215   GFRAA >90 05/03/2013 1215   Lipase     Component Value Date/Time   LIPASE 25 05/03/2013 1215       Studies/Results: Ct Abdomen Pelvis W  Contrast  05/03/2013   CLINICAL DATA:  Right lower quadrant pain.  Vomiting.  EXAM: CT ABDOMEN AND PELVIS WITH CONTRAST  TECHNIQUE: Multidetector CT imaging of the abdomen and pelvis was performed using the standard protocol following bolus administration of intravenous contrast.  CONTRAST:  100 mL OMNIPAQUE IOHEXOL 300 MG/ML  SOLN  COMPARISON:  None.  FINDINGS: The lung bases are clear.  No pleural or pericardial effusion.  The gallbladder, liver, spleen, adrenal glands, kidneys, pancreas and biliary appear normal. There is an appendicolith at the appendiceal orifice. The appendix is dilated at 1.3 cm. Tiny amount of free pelvic fluid is present. The stomach, small bowel and colon appear normal. There is no lymphadenopathy. No focal bony abnormality is identified.  IMPRESSION: Study is positive for acute appendicitis without abscess or perforation.  Critical Value/emergent results were called by telephone at the time of interpretation on 05/03/2013 at 2:23 PM to Bradley Harmon, P.A., who verbally acknowledged these results.   Electronically Signed   By: Drusilla Kannerhomas  Dalessio M.D.   On: 05/03/2013 14:25    Anti-infectives: Anti-infectives   Start     Dose/Rate Route Frequency Ordered Stop   05/03/13 2200  piperacillin-tazobactam (ZOSYN) IVPB 3.375 g  3.375 g 12.5 mL/hr over 240 Minutes Intravenous 3 times per day 05/03/13 1951 05/04/13 1359   05/03/13 1615  piperacillin-tazobactam (ZOSYN) IVPB 3.375 g  Status:  Discontinued     3.375 g 12.5 mL/hr over 240 Minutes Intravenous 3 times per day 05/03/13 1604 05/03/13 1951   05/03/13 1500  piperacillin-tazobactam (ZOSYN) IVPB 3.375 g     3.375 g 100 mL/hr over 30 Minutes Intravenous  Once 05/03/13 1458 05/03/13 1602       Assessment/Plan Acute appendicitis POD #1 s/p laparoscopic appendectomy  Plan: 1.  IVF, pain control, antiemetics, antibiotics 2.  Advance diet as tolerated 3.  Ambulate and IS 4.  SCD's and lovenox 5.  Umbilicus wound draining  sanguinous drainage, would like to keep an eye on this to make sure that site doesn't open up.  Dry dressing changes BID. 6.  The patient was traveling from [place] to [place]. When the pain started.  Wife flying in Saturday if weather permits.  Plan for discharge home tomorrow am.        LOS: 1 day    Bradley Harmon, Bradley Harmon 05/04/2013, 8:49 AM Pager: [phone]

## 2013-05-04 NOTE — Progress Notes (Signed)
Patient interviewed and examined, agree with PA note above.  Bradley Harmon T Montie Swiderski MD, FACS  05/04/2013 10:17 AM  

## 2013-05-05 MED ORDER — OXYCODONE-ACETAMINOPHEN 5-325 MG PO TABS: 1.0000 | ORAL_TABLET | ORAL | Status: AC | PRN

## 2013-05-05 MED ORDER — DOCUSATE SODIUM 100 MG PO CAPS: 100.0000 mg | ORAL_CAPSULE | Freq: Two times a day (BID) | ORAL | Status: AC

## 2013-05-05 MED ORDER — DOCUSATE SODIUM 100 MG PO CAPS: 100.0000 mg | ORAL_CAPSULE | Freq: Two times a day (BID) | ORAL | Status: DC | PRN

## 2013-05-05 NOTE — Discharge Summary (Signed)
Physician Discharge Summary  Patient ID: Bradley Harmon MRN: [id number] DOB/AGE: Jul 05, 1980 33 y.o.  Admit date: 05/03/2013 Discharge date: 05/05/2013  Admitting Diagnosis: Acute Appendicitis  Discharge Diagnosis Patient Active Problem List   Diagnosis Date Noted  . Acute appendicitis 05/03/2013    Consultants None  Imaging: Ct Abdomen Pelvis W Contrast  05/03/2013   CLINICAL DATA:  Right lower quadrant pain.  Vomiting.  EXAM: CT ABDOMEN AND PELVIS WITH CONTRAST  TECHNIQUE: Multidetector CT imaging of the abdomen and pelvis was performed using the standard protocol following bolus administration of intravenous contrast.  CONTRAST:  100 mL OMNIPAQUE IOHEXOL 300 MG/ML  SOLN  COMPARISON:  None.  FINDINGS: The lung bases are clear.  No pleural or pericardial effusion.  The gallbladder, liver, spleen, adrenal glands, kidneys, pancreas and biliary appear normal. There is an appendicolith at the appendiceal orifice. The appendix is dilated at 1.3 cm. Tiny amount of free pelvic fluid is present. The stomach, small bowel and colon appear normal. There is no lymphadenopathy. No focal bony abnormality is identified.  IMPRESSION: Study is positive for acute appendicitis without abscess or perforation.  Critical Value/emergent results were called by telephone at the time of interpretation on 05/03/2013 at 2:23 PM to [facility] Harmon, P.A., who verbally acknowledged these results.   Electronically Signed   By: Drusilla Kannerhomas  Dalessio M.D.   On: 05/03/2013 14:25    Procedures Dr. Johna SheriffHoxworth (05/03/13/15) - Laparoscopic Appendectomy  Hospital Course:  Healthy 33 y/o male driving to [place] From [place] for a new job he stopped at a hotel in [place] and awoke about 3AM today with abdominal pain, then nausea and vomiting. He could not go further and came to the ER [facility] at [facility]. Work up shows an elevated WBC, and CT scan show: The appendix is dilated at 1.3 cm. Tiny amount of free pelvic fluid is present.  He has  pain in the RLQ, only relieved by morphine. He works for [employer], and has never been sick before.   Patient was admitted and underwent procedure listed above.  Tolerated procedure well and was transferred to the floor.  He had some post-operative bleeding from the umbilical site which was monitored on POD #1 as well as working on better pain control.  Diet was advanced as tolerated.  On POD #2, the patient was voiding well, tolerating diet, ambulating well, pain well controlled, vital signs stable, incisions c/d/i and felt stable for discharge home.  Patient will follow up in our office as needed and knows to call with questions or concerns.  Because he is moving to [place] we have advised him to have a follow up with his new primary care provider within 1-2 weeks for a post-op check.  He will not need any additional antibiotics.  We discussed the weight restrictions including no lifting/pushing/pulling >20lbs for 3 weeks and no lifting >40lbs until 6 weeks post-op.  He will be out of work for 2 weeks and return on light duty.  I have advised him to not wear his gun and gun belt for 3 weeks.  He is advised to not drive while on the pain medication.  6 weeks post-op he has no restrictions.    Physical Exam: General:  Alert, NAD, pleasant, comfortable Abd:  Soft, ND, mild tenderness, incisions C/D/I    Medication List         oxyCODONE-acetaminophen 5-325 MG per tablet  Commonly known as:  PERCOCET/ROXICET  Take 1-2 tablets by mouth every 4 (four)  hours as needed for moderate pain.         Follow-up Information   Call [facility]. (As needed.  You should follow up with at least a primary care provider in 2-3 weeks for a post-operation check.  They can refer you to surgeon in your new home town if needed.  )    Contact information:   [address]   [place] [address] [phone]       Follow up with Pcp Not In System In 2 weeks. (Contact a  primary care provider)       Signed: Aris Georgia, [facility] [phone]  05/05/2013, 7:52 AM

## 2013-05-05 NOTE — Discharge Instructions (Signed)
[facility] ______[facility] °LAPAROSCOPIC SURGERY: POST OP INSTRUCTIONS °Always review your discharge instruction sheet given to you by the facility where your surgery was performed. °IF YOU HAVE DISABILITY OR FAMILY LEAVE FORMS, YOU MUST BRING THEM TO THE OFFICE FOR PROCESSING.   °DO NOT GIVE THEM TO YOUR DOCTOR. ° °1. A prescription for pain medication may be given to you upon discharge.  Take your pain medication as prescribed, if needed.  If narcotic pain medicine is not needed, then you may take acetaminophen (Tylenol) or ibuprofen (Advil) as needed. °2. Take your usually prescribed medications unless otherwise directed. °3. If you need a refill on your pain medication, please contact your pharmacy.  They will contact our office to request authorization. Prescriptions will not be filled after 5pm or on week-ends. °4. You should follow a light diet the first few days after arrival home, such as soup and crackers, etc.  Be sure to include lots of fluids daily. °5. Most patients will experience some swelling and bruising in the area of the incisions.  Ice packs will help.  Swelling and bruising can take several days to resolve.  °6. It is common to experience some constipation if taking pain medication after surgery.  Increasing fluid intake and taking a stool softener (such as Colace) will usually help or prevent this problem from occurring.  A mild laxative (Milk of Magnesia or Miralax) should be taken according to package instructions if there are no bowel movements after 48 hours. °7. Unless discharge instructions indicate otherwise, you may remove your bandages 24-48 hours after surgery, and you may shower at that time.  You may have steri-strips (small skin tapes) in place directly over the incision.  These strips should be left on the skin for 7-10 days.  If your surgeon used skin glue on the incision, you may shower in 24 hours.  The glue will flake off over the next 2-3 weeks.  Any sutures or  staples will be removed at the office during your follow-up visit. °8. ACTIVITIES:  You may resume regular (light) daily activities beginning the next day--such as daily self-care, walking, climbing stairs--gradually increasing activities as tolerated.  You may have sexual intercourse when it is comfortable.  Refrain from any heavy lifting or straining until approved by your doctor. °a. You may drive when you are no longer taking prescription pain medication, you can comfortably wear a seatbelt, and you can safely maneuver your car and apply brakes. °b. RETURN TO WORK:  __________________________________________________________ °9. You should see your doctor in the office for a follow-up appointment approximately 2-3 weeks after your surgery.  Make sure that you call for this appointment within a day or two after you arrive home to insure a convenient appointment time. °10. OTHER INSTRUCTIONS: __________________________________________________________________________________________________________________________ __________________________________________________________________________________________________________________________ °WHEN TO CALL YOUR DOCTOR: °1. Fever over 101.0 °2. Inability to urinate °3. Continued bleeding from incision. °4. Increased pain, redness, or drainage from the incision. °5. Increasing abdominal pain ° °The clinic staff is available to answer your questions during regular business hours.  Please don’t hesitate to call and ask to speak to one of the nurses for clinical concerns.  If you have a medical emergency, go to the nearest emergency room or call 911.  A surgeon from [facility] is always on call at the hospital. °[address], [address], [place], [place]  [address] ? [address] [address], [place],    [address] °[phone] ? [phone] ? FAX [phone] °Web site:   [email or web address] °

## 2013-05-05 NOTE — Progress Notes (Signed)
Discharge information and Rx's reviewed. Care of the surgical sites reviewed. When to call the doctor reviewed with pt as well. Pt states after lunch he will shower and then get ready to go to hotel. Pt's wife will be flying in tomorrow. Pt will have a follow up appointment with physicians in home town.

## 2013-05-07 NOTE — ED Provider Notes (Signed)
Medical screening examination/treatment/procedure(s) were performed by non-physician practitioner and as supervising physician I was immediately available for consultation/collaboration.  Toy BakerAnthony T Dantrell Schertzer, MD 05/07/13 [phone]

## 2015-10-27 IMAGING — CT CT ABD-PELV W/ CM
2 of 4 series · 17 of 46 positions shown, 19 images · IV contrast (APPLIED)
Comparison: None.

CLINICAL DATA: Right lower quadrant pain.  Vomiting.

EXAM:
CT ABDOMEN AND PELVIS WITH CONTRAST
TECHNIQUE: Multidetector CT imaging of the abdomen and pelvis was performed
using the standard protocol following bolus administration of
intravenous contrast.
CONTRAST:  100 mL OMNIPAQUE IOHEXOL 300 MG/ML  SOLN

[Series 2: abd/ pelvis 5.0 i30f 1 · axial · 0.74mm/px · z∈[-622,-212]mm · 14 of 90 slices shown, 16 images]
[im 4/90  soft-tissue]
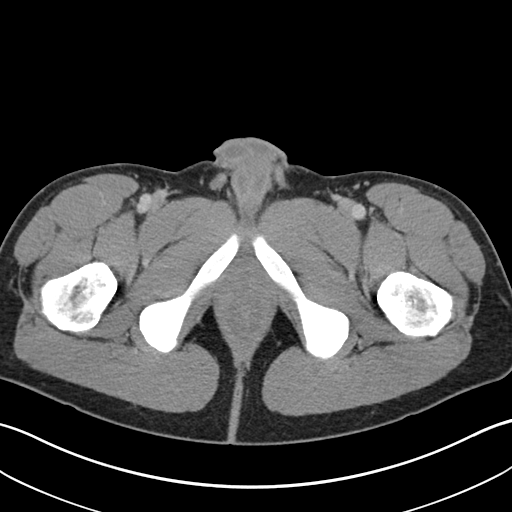
[im 4/90  bone]
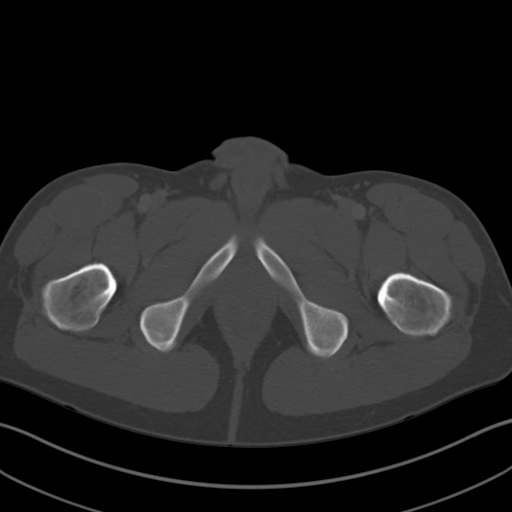
[im 11/90  soft-tissue]
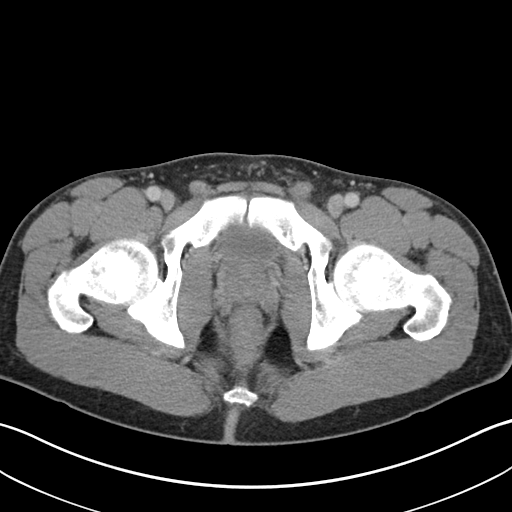
[im 18/90  soft-tissue]
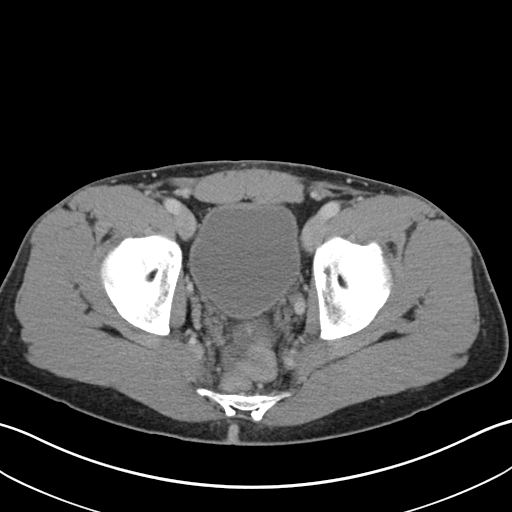
[im 25/90  soft-tissue]
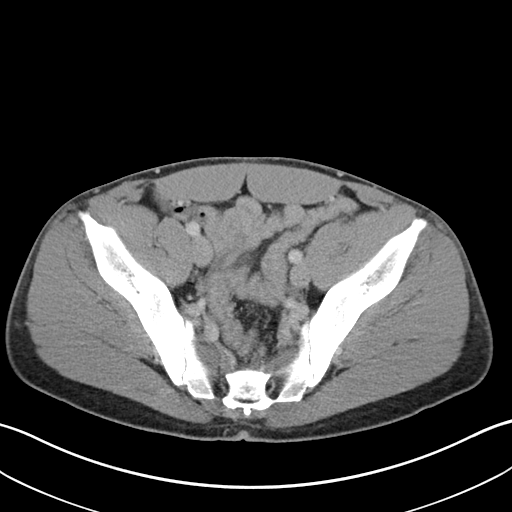
[im 29/90  soft-tissue]
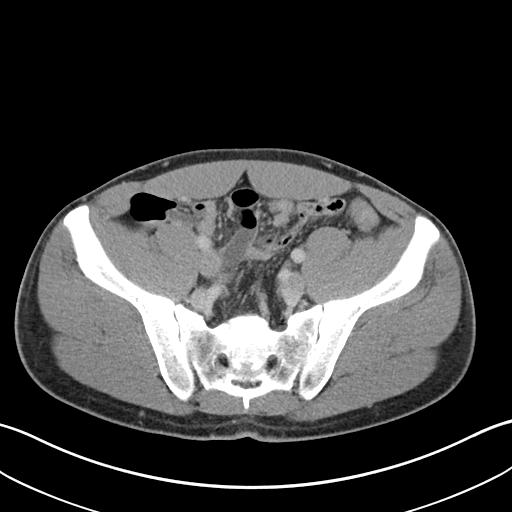
[im 36/90  soft-tissue]
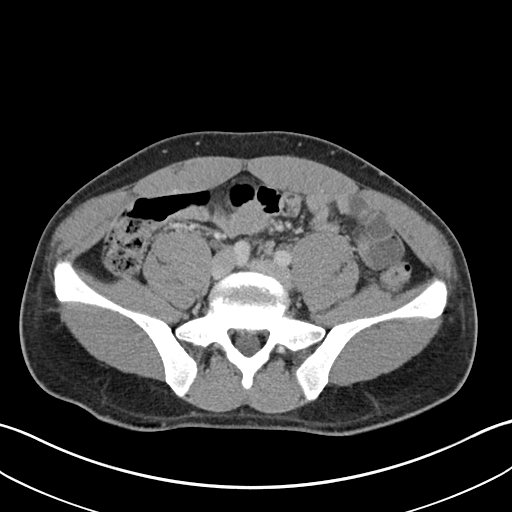
[im 43/90  soft-tissue]
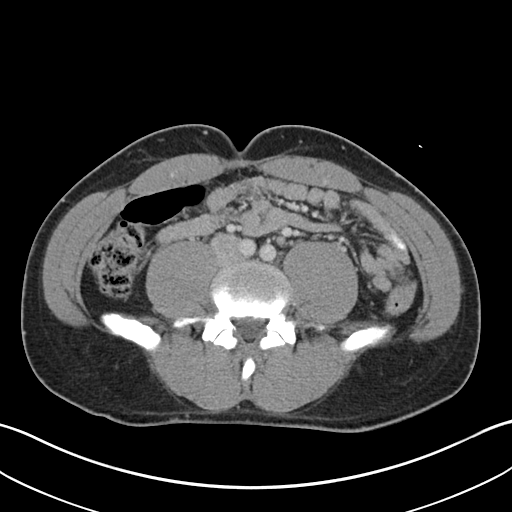
[im 47/90  soft-tissue]
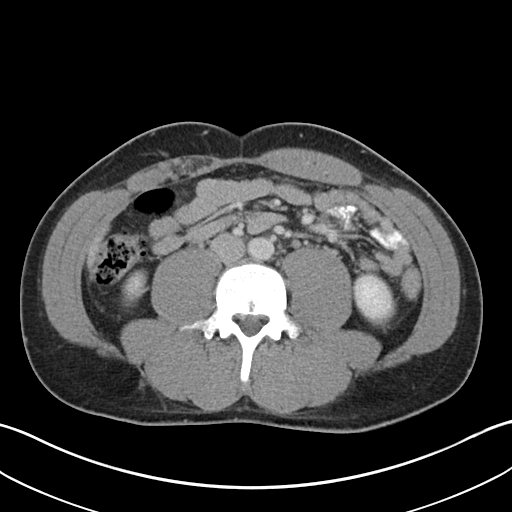
[im 54/90  soft-tissue]
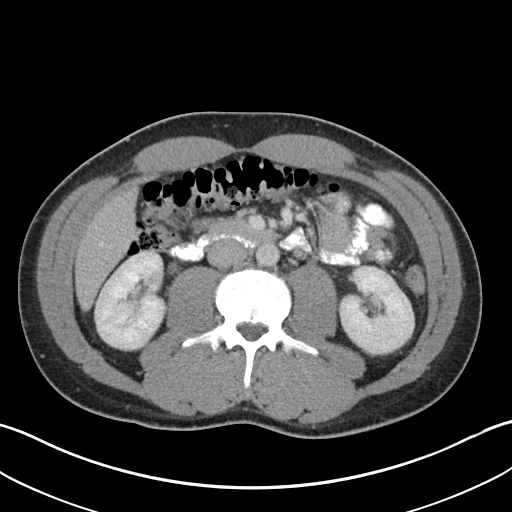
[im 54/90  bone]
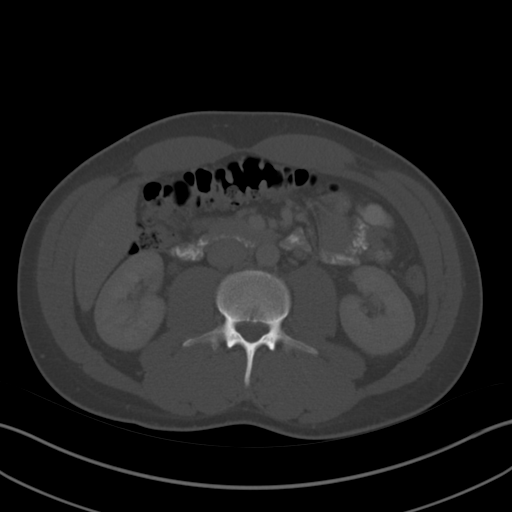
[im 61/90  soft-tissue]
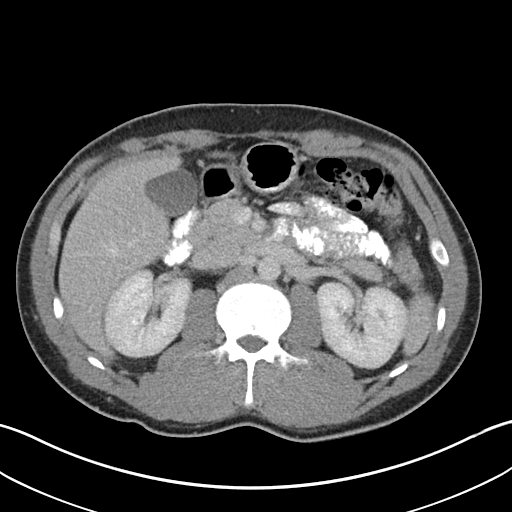
[im 68/90  soft-tissue]
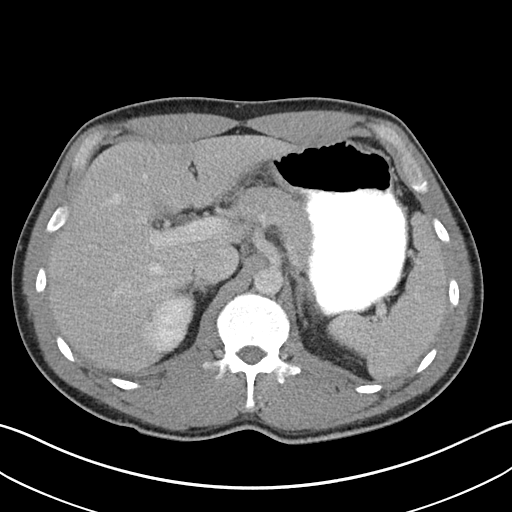
[im 72/90  soft-tissue]
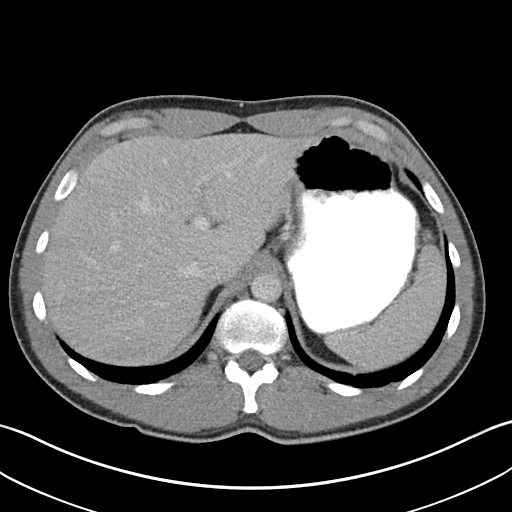
[im 79/90  soft-tissue]
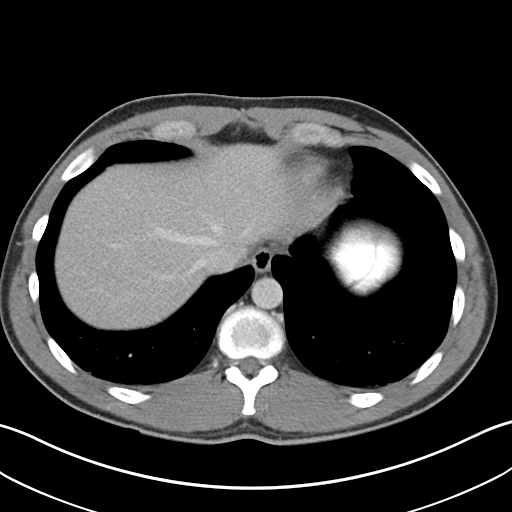
[im 86/90  soft-tissue]
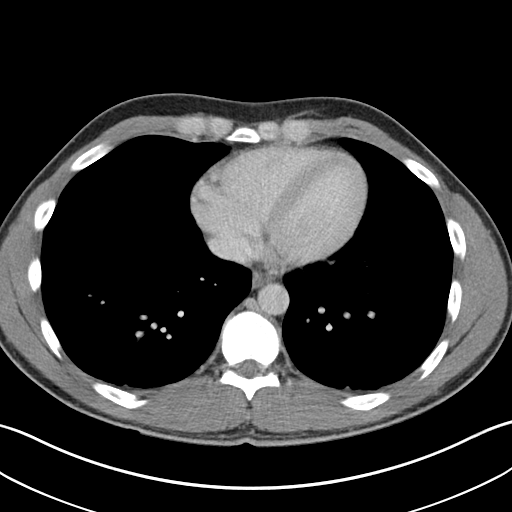

[Series 5: coronal soft tissue · coronal · 0.74mm/px · 3 of 84 slices shown]
[im 28/84  soft-tissue]
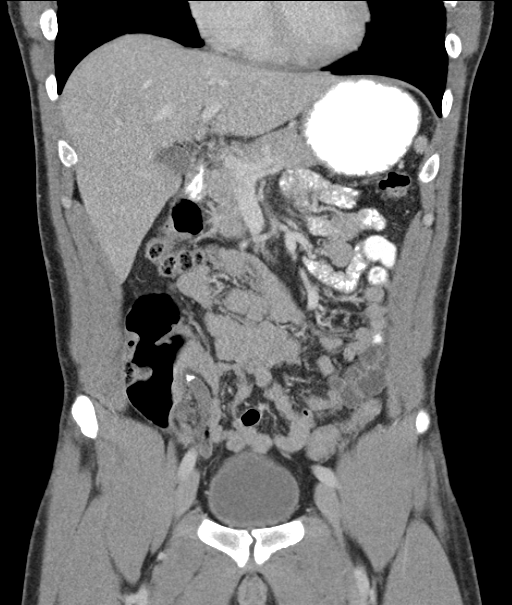
[im 37/84  soft-tissue]
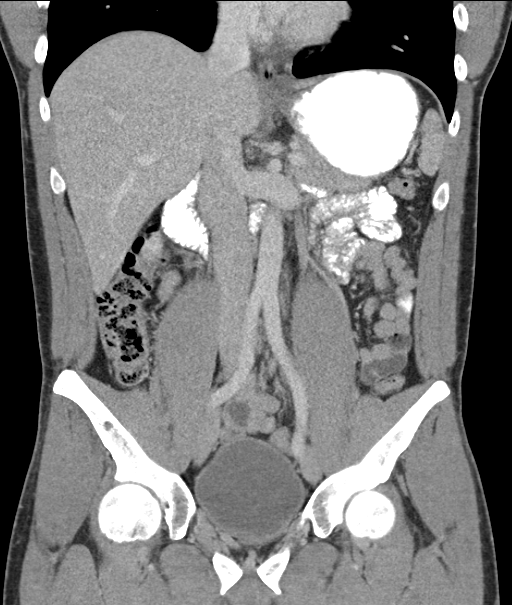
[im 47/84  soft-tissue]
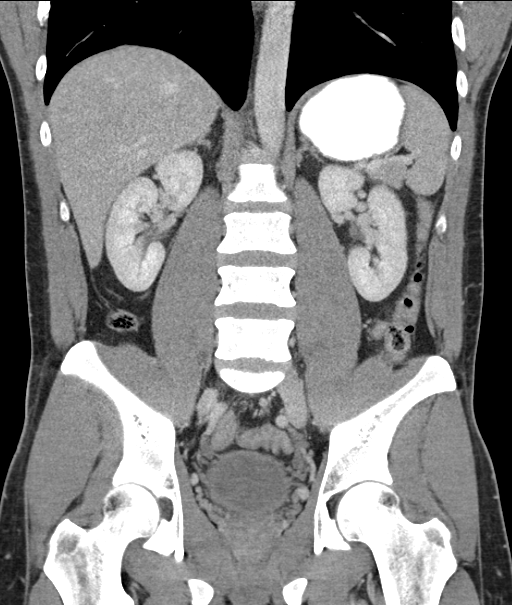

[17 of 46 positions shown; findings below may reference images not displayed]

FINDINGS: The lung bases are clear.  No pleural or pericardial effusion.

The gallbladder, liver, spleen, adrenal glands, kidneys, pancreas
and biliary appear normal. There is an appendicolith at the
appendiceal orifice. The appendix is dilated at 1.3 cm. Tiny amount
of free pelvic fluid is present. The stomach, small bowel and colon
appear normal. There is no lymphadenopathy. No focal bony
abnormality is identified.
IMPRESSION: Study is positive for acute appendicitis without abscess or
perforation.

Critical Value/emergent results were called by telephone at the time
of interpretation on 05/03/2013 at [DATE] to ERXLEBEN, FERIENHAUS.,
who verbally acknowledged these results.

## 2019-04-04 ENCOUNTER — Encounter (INDEPENDENT_AMBULATORY_CARE_PROVIDER_SITE_OTHER): Payer: Self-pay

## 2019-04-07 ENCOUNTER — Ambulatory Visit: Payer: Self-pay

## 2021-06-03 ENCOUNTER — Encounter (INDEPENDENT_AMBULATORY_CARE_PROVIDER_SITE_OTHER): Payer: Self-pay | Admitting: Nurse Practitioner

## 2021-06-03 ENCOUNTER — Ambulatory Visit (INDEPENDENT_AMBULATORY_CARE_PROVIDER_SITE_OTHER): Payer: BLUE CROSS/BLUE SHIELD | Admitting: Nurse Practitioner

## 2021-06-03 VITALS — BP 115/78 | HR 70 | Temp 96.9°F | Resp 16 | Ht 71.0 in | Wt 198.0 lb

## 2021-06-03 DIAGNOSIS — R011 Cardiac murmur, unspecified: Secondary | ICD-10-CM

## 2021-06-03 DIAGNOSIS — Z800 Family history of malignant neoplasm of digestive organs: Secondary | ICD-10-CM

## 2021-06-03 DIAGNOSIS — Z0000 Encounter for general adult medical examination without abnormal findings: Secondary | ICD-10-CM | POA: Insufficient documentation

## 2021-06-03 LAB — COMPREHENSIVE METABOLIC PANEL
ALT: 29 U/L (ref 0–55)
AST (SGOT): 35 U/L (ref 5–41)
Albumin/Globulin Ratio: 1.8 (ref 0.9–2.2)
Albumin: 4.5 g/dL (ref 3.5–5.0)
Alkaline Phosphatase: 60 U/L (ref 37–117)
Anion Gap: 9 (ref 5.0–15.0)
BUN: 14 mg/dL (ref 9.0–28.0)
Bilirubin, Total: 1.4 mg/dL — ABNORMAL HIGH (ref 0.2–1.2)
CO2: 29 mEq/L (ref 17–29)
Calcium: 9.7 mg/dL (ref 8.5–10.5)
Chloride: 102 mEq/L (ref 99–111)
Creatinine: 0.9 mg/dL (ref 0.5–1.5)
Globulin: 2.5 g/dL (ref 2.0–3.6)
Glucose: 89 mg/dL (ref 70–100)
Potassium: 4.5 mEq/L (ref 3.5–5.3)
Protein, Total: 7 g/dL (ref 6.0–8.3)
Sodium: 140 mEq/L (ref 135–145)

## 2021-06-03 LAB — URINALYSIS, REFLEX TO MICROSCOPIC EXAM IF INDICATED
Bilirubin, UA: NEGATIVE
Blood, UA: NEGATIVE
Glucose, UA: NEGATIVE
Ketones UA: NEGATIVE
Leukocyte Esterase, UA: NEGATIVE
Nitrite, UA: NEGATIVE
Specific Gravity UA: 1.019 (ref 1.001–1.035)
Urine pH: 8 (ref 5.0–8.0)
Urobilinogen, UA: NORMAL mg/dL

## 2021-06-03 LAB — CBC AND DIFFERENTIAL
Absolute NRBC: 0 10*3/uL (ref 0.00–0.00)
Basophils Absolute Automated: 0.04 10*3/uL (ref 0.00–0.08)
Basophils Automated: 0.9 %
Eosinophils Absolute Automated: 0.09 10*3/uL (ref 0.00–0.44)
Eosinophils Automated: 2 %
Hematocrit: 45.5 % (ref 37.6–49.6)
Hgb: 15.7 g/dL (ref 12.5–17.1)
Immature Granulocytes Absolute: 0.02 10*3/uL (ref 0.00–0.07)
Immature Granulocytes: 0.4 %
Instrument Absolute Neutrophil Count: 2.25 10*3/uL (ref 1.10–6.33)
Lymphocytes Absolute Automated: 1.76 10*3/uL (ref 0.42–3.22)
Lymphocytes Automated: 39.5 %
MCH: 30 pg (ref 25.1–33.5)
MCHC: 34.5 g/dL (ref 31.5–35.8)
MCV: 86.8 fL (ref 78.0–96.0)
MPV: 11 fL (ref 8.9–12.5)
Monocytes Absolute Automated: 0.3 10*3/uL (ref 0.21–0.85)
Monocytes: 6.7 %
Neutrophils Absolute: 2.25 10*3/uL (ref 1.10–6.33)
Neutrophils: 50.5 %
Nucleated RBC: 0 /100 WBC (ref 0.0–0.0)
Platelets: 224 10*3/uL (ref 142–346)
RBC: 5.24 10*6/uL (ref 4.20–5.90)
RDW: 12 % (ref 11–15)
WBC: 4.46 10*3/uL (ref 3.10–9.50)

## 2021-06-03 LAB — GFR: EGFR: >60

## 2021-06-03 LAB — LIPID PANEL
Cholesterol / HDL Ratio: 2.9 Index
Cholesterol: 177 mg/dL (ref 0–199)
HDL: 61 mg/dL (ref 40–9999)
LDL Calculated: 107 mg/dL — ABNORMAL HIGH (ref 0–99)
Triglycerides: 43 mg/dL (ref 34–149)
VLDL Calculated: 9 mg/dL — ABNORMAL LOW (ref 10–40)

## 2021-06-03 LAB — HEMOGLOBIN A1C
Average Estimated Glucose: 93.9 mg/dL
Hemoglobin A1C: 4.9 % (ref 4.6–5.9)

## 2021-06-03 LAB — HEMOLYSIS INDEX: Hemolysis Index: 12 Index (ref 0–24)

## 2021-06-03 LAB — HEPATITIS C ANTIBODY: Hepatitis C, AB: NONREACTIVE

## 2021-06-03 LAB — TSH: TSH: 0.83 u[IU]/mL (ref 0.35–4.94)

## 2021-06-03 NOTE — Progress Notes (Addendum)
Subjective:      Date: 06/03/2021 11:19 AM   Patient ID: Edward Kelly is a 41 y.o. male.    Chief Complaint:  Chief Complaint   Patient presents with    Establish Care     New pt     Annual Exam       HPI    41 yo male presents to the office for physical and FBW.     Mom has hx stomach cancer, passed away at the age of 33.     He moved from [place] in 2015.     Never diagnosed with HTN, HLD, seizure, asthma and heart murmur.     Sexual active with  wife.     He has 7 years and 64 years old kids.     He works for [employer].             Visit Type: Health Maintenance Visit  Work Status: working full-time  Reported Health: good health  Reported Diet: moderate compliance with well-balanced diet  Reported Exercise: regularly  Dental: dentist visit within 6-12 months  Vision: regular eye exams   Hearing: normal hearing  Immunization Status: immunizations up to date  Reproductive Health: sexually active  Prior Screening Tests: prostate cancer screening not appropriate at this time and no previous colorectal cancer screening  General Health Risks: no family history of prostate cancer, no family history of colon cancer, and no family history of breast cancer  Safety Elements Used: uses seat belts, smoke detectors in household, and no guns at home    Problem List:  There is no problem list on file for this patient.      Current Medications:  No outpatient medications have been marked as taking for the 06/03/21 encounter (Office Visit) with Marga Melnick, FNP.       Allergies:  No Known Allergies    Past Medical History:  History reviewed. No pertinent past medical history.    Past Surgical History:  History reviewed. No pertinent surgical history.    Family History:  Family History   Problem Relation Age of Onset    Cancer Mother     No known problems Father        Social History:  Social History     Tobacco Use    Smoking status: Never   Substance Use Topics    Alcohol use: Yes     Comment: socially          The  following sections were reviewed this encounter by the provider: Meds    Problems         ROS:   General/Constitutional:   Denies Change in appetite. Denies Chills. Denies Fatigue. Denies Fever. Denies Weight gain. Denies Weight loss.   Ophthalmologic:   Denies Blurred vision. Denies Diminished visual acuity. Denies Eye Pain.   ENT:   Denies Hearing Loss. Denies Nasal Discharge. Denies Hoarseness. Denies Ear pain. Denies Nosebleed. Denies Sinus pain. Denies Sore throat. Denies Sneezing.   Endocrine:   Denies Polydipsia. Denies Polyuria.   Cardiovascular:   Denies Chest pain. Denies Chest pain with exertion. Denies Leg Claudication. Denies Palpitations. Denies Swelling in hands/feet.   Respiratory:   Denies Paroxysmal Nocturnal Dyspnea. Denies Cough. Denies Orthopnea. Denies Shortness of breath. Denies Daytime Hypersomnolence. Denies Snoring. Denies Witness Apnea. Denies Wheezing.   Gastrointestinal:   Denies Abdominal pain. Denies Blood in stool. Denies Constipation. Denies Diarrhea. Denies Heartburn. Denies Nausea. Denies Vomiting.   Hematology:   Denies  Easy bruising. Denies Easy Bleeding.   Genitourinary:   Denies Blood in urine. Denies Nocturia.   Musculoskeletal:   Denies Joint pain. Denies Leg cramps. Denies Weakness in LE. Denies Swollen joints.   Peripheral Vascular:   Denies Cold extremities. Denies Decreased sensation in extremities. Denies Painful extremities.   Skin:   Denies Itching. Denies Change in Mole(s). Denies Rash.   Neurologic:   Denies Balance difficulty. Denies Dizziness. Denies Headache. Denies Pre-Syncope. Denies Memory loss.   Psychiatric:   Denies Anxiety. Denies Depressed mood. Denies Difficulty sleeping. Denies Mood Swings.     Objective:     Vitals:  BP 115/78 (BP Site: Left arm, Patient Position: Sitting, Cuff Size: Medium)   Pulse 70   Temp (!) 96.9 F (36.1 C) (Temporal)   Resp 16   Ht 1.803 m (5\' 11" )   Wt 89.8 kg (198 lb)   SpO2 99%   BMI 27.62 kg/m     Examination:    General Examination:   GENERAL APPEARANCE: alert, in no acute distress, well developed, well nourished, oriented to time, place, and person.   HEAD: normal appearance, atraumatic.   EYES: extraocular movement intact (EOMI), pupils equal, round, reactive to light and accommodation, sclera anicteric, conjunctiva clear.   EARS: tympanic membranes normal bilaterally, external canals normal .   NOSE: normal nasal mucosa, no lesions.   ORAL CAVITY: normal oropharynx, normal lips, mucosa moist, no lesions.   THROAT: normal appearance, clear, no erythema.   NECK/THYROID: neck supple, no carotid bruit, carotid pulse 2+ bilaterally, no cervical lymphadenopathy, no neck mass palpated, no jugular venous distention, no thyromegaly.   LYMPH NODES: no palpable adenopathy.   SKIN: good turgor, no rashes, no suspicious lesions.   HEART: S1, S2 normal,  Grade 2 x 6 systolic ejection murmur noted in the mitral area radiating to axilla   LUNGS: normal effort / no distress, normal breath sounds, clear to auscultation bilaterally, no wheezes, rales, rhonchi.   ABDOMEN: bowel sounds present, no hepatosplenomegaly, soft, nontender, nondistended.     MALE GENITOURINARY: no penile lesions or discharge, no testicular mass.   MUSCULOSKELETAL: full range of motion, no swelling or deformity.   EXTREMITIES: no edema, no clubbing, cyanosis, or edema.   PERIPHERAL PULSES: 2+ dorsalis pedis, 2+ posterior tibial.   NEUROLOGIC: nonfocal, cranial nerves 2-12 grossly intact, deep tendon reflexes 2+ symmetrical, normal strength, tone and reflexes, sensory exam intact.   PSYCH: cognitive function intact, mood/affect full range, speech clear.     Assessment/Plan:         Annual physical exam    - CBC and differential  - Comprehensive metabolic panel  - TSH  - Lipid panel  - Hemoglobin A1C  - Hepatitis C (HCV) antibody, Total  - Last Tdap 2015      2. Heart murmur    - referred to cardiology for further evaluation.  - Cardiology Referral: Madelon Lips,  MD ([facility]); Future    3. Family history of stomach cancer    - His mom passed away from stomach cancer at the age of 82, referred to GI today.   - Referral to Gastroenterology    Body mass index is 27.62 kg/m.        Risk & Benefits of the new medication(s) were explained to the patient who verbalized understanding & agreed to the treatment plan        Health Maintenance:    I provided following Counseling/Anticipatory Guidance:  1. Importance of proper nutrition  and maintaining healthy weight.       Increase vegetables, fruits, lentils, beans and fiber in your diet.       BMI goal; <30.   Blood Pressure goal: <140/90  2. Importance of aerobic/strengthening  Exercises and prevention of injuries.      Exercise at least 30 minutes 5 days per week.     Always wear your seatbelt in the car.     Wear sunscreen that is broad-spectrum (UVA/UVB) and at least SPF 30.  3. Importance of avoiding misuse of Tobacco, Alcohol, and mood altering drugs.       Avoid heavy drinking ( For Men < 14, and for Male < 7 drinks per week ).  4. Importance of proper dental and mental health.      Schedule dental exam and cleaning every 6 months      Have your vision checked every 1-2 years.  5. Importance of proper Immunization as recommended by ACIP.      Get a flu vaccine yearly in the fall.      Td or Tdap vaccine every 10 years.   6. Importance of proper Screening tests as recommended by USPSTF.      Colonoscopy test:  at least every 10 years starting at age 25.   62. Labs are ordered.      8. Suggest : Annual preventative visit.  This is different and separate from any problem-related visit.   9. Suggest  Website for medical information : [email or web address]   [facility] website :   [email or web address]      No follow-ups on file.    Marga Melnick, FNP

## 2021-06-03 NOTE — Progress Notes (Signed)
Have you seen any specialists since your last visit with us?  No      The patient was informed that the following HM items are still outstanding:   nothing at this time, HM is up-to-date.

## 2021-06-12 ENCOUNTER — Encounter (INDEPENDENT_AMBULATORY_CARE_PROVIDER_SITE_OTHER): Payer: Self-pay | Admitting: Gastroenterology

## 2021-06-12 ENCOUNTER — Telehealth (INDEPENDENT_AMBULATORY_CARE_PROVIDER_SITE_OTHER): Payer: BLUE CROSS/BLUE SHIELD | Admitting: Gastroenterology

## 2021-06-12 VITALS — Ht 72.0 in | Wt 195.0 lb

## 2021-06-12 DIAGNOSIS — Z800 Family history of malignant neoplasm of digestive organs: Secondary | ICD-10-CM

## 2021-06-12 MED ORDER — PEG 3350/ELECTROLYTES 240 G PO SOLR
ORAL | 0 refills | Status: DC
Start: 2021-06-12 — End: 2021-10-20

## 2021-06-12 NOTE — Patient Instructions (Signed)
Split Dose Colonoscopy Preparation Instructions - COLYTE, GAVILYTE,   GOLYTELY, OR NULYTELY    Please carefully read a week before your procedure.  IF YOU ARE ON BLOOD THINNERS (COUMADIN, PLAVIX, etc.), INSULIN OR OTHER   DIABETIC MEDICATIONS, PLEASE LET US KNOW AND CHECK WITH YOUR   PRESCRIBING PHYSICIAN FOR INSRUCTIONS.   Your prescribing provider needs to determine if you should stop or stay on your blood thinner before procedure.  Not following these instructions may result in cancellation.    General Endoscopy Information   Do no chew gum or suck on hard candy the day of your procedure.   You must have a responsible adult to accompany you home after the procedure. This   person must pick you up in the endoscopy unit. If you do not have a responsible adult   to accompany you home, your procedure will be cancelled.    You may not operate a motor vehicle for the remainder of the day following your   procedure.    You may not take a taxi, Uber or bus home unless accompanied by a responsible   adult.    If your insurance company requires a REFERRAL, YOU MUST BRING IT WITH YOU.   Also, please bring your current insurance card(s), copay (if applicable), and a current   picture ID with you on the day of your procedure.    Our office will contact your insurance carrier to verify coverage and, if required, obtain   preauthorization for your procedure. However, preauthorization is not a guarantee of   payment and you will be responsible for any deductibles, copays, co-insurance, and/or   any other plan specific out-of-pocket expenses.    Dependent upon your family history, personal history, prior gastroenterology   diagnoses, or findings discovered during your colonoscopy, your procedure may be   considered preventative or diagnostic. This determination will not be made until after   the procedure has concluded and will be based upon the findings of your exam. In our   experience, many insurance carriers cover  preventative and diagnostic colonoscopies   differently, and as a result, your out-of-pocket payment may also differ. If you have any   questions about your coverage, please contact your insurance carrier directly.    If you have any questions, please contact your GI physician's office during normal   business hours.    Preparation Instructions    One (1) day before the procedure date: Split Dose: 1st half  1. Drink only clear liquids the entire day. No solid food should be taken. Clear liquids include:  water, broth without any solid pieces, apple juice, white grape juice, pulp free lemonade,   sprite, ginger-ale, coffee or tea without milk or nondairy creamers, plain Jell-) (no added   fruit or toppings, no red, purple, or blue Jello-O). See clear diet below.    2. Prepare the solution: Gavilyte, Golytely, Nulytely or Colyte Prep: mix the powder with   water in the provided plastic container to the 'fill" line and chill in the refrigerator.  3. You may add "Crystal Light" powdered lemonade (as an alternative to the flavor packets)  to the solution to improve its taste.  4. No solid food should be taken during or after the prep.   5. At 5 p.m.: Begin 1st dose of the prep solution at a rate of 8 ounces every 15-30 minutes   (over 1-2 hours) until half of the prep solution is finished. If you feel full   or nauseated by   drinking the solution, then slow down and finish the first half of the solution before   midnight.  6. Please continue to drink clear liquids until you go to bed.  * It is not uncommon for individuals to experience bloating or nausea when drinking the   solution. If vomiting or other symptoms concern you, please call your GI physician's office.    The day of your procedure: Split dose: 2nd half  1. 6 hours before your procedure time: Start 2nd dose of prep solution. Depending on your   procedure start time this may require waking up early to complete the prep.  2. Drink the solution at a rate of 8  ounces every 15-30 minutes (over 1-2 hours) until you   finish the entire solution.  3. It is important that you finish the remaining 2 liters of the prep solution at least 4 hours  before your scheduled procedure.  4. Complete 2nd dose 4 hours prior to your scheduled start time. Do not take anything   by mouth starting 4 hours before your procedure.  5. Do not eat hard candy or chew gum.   Wear comfortable clothing that is easy to remove and leave jewelry at home. Leave any   valuables (i.e., purse, cell phone etc.) with family or companion.  6. Report to the Endoscopy Procedure Unit 90 minutes before your scheduled procedure   time.  7. Once in the pre-procedural area, you will be asked to put on a hospital gown. A nurse will   review your medical history with you (Bring a list of your current medication, allergies, and   a copy of your recent EKG). An intravenous line (IV) will be started for your sedation   during the procedure.  8. When your procedure is done, you may remain in the recovery room for up to 1 hour.  9. Your doctor will discuss the results of your procedure with you and give you a copy of your   report.     CLEAR LIQUID DIET  THESE ITEMS ARE ALLOWED:  Water  Clear broth: beef, chicken, vegetable  Juices   Apple juice or cider   White grape juice, white cranberry   juice   Tang  Lemonade  Soda (clear color)  Tea  Coffee (without cream)  Clear gelatin (without fruit)  Popsicles (without fruit or cream)  Italian ices    THESE ITEMS ARE NOT ALLOWED:  Milk   Cream  Milshakes  Tomato juice  Orange juice  Cream soups  Any soup other than the listed broth  Oatmeal  Cream of Wheat  Grapefruit juice  Alcohol    PLEASE NOTE: It is extremely important to follow the preparation listed above, so that   the doctor will be able to see your entire colon. Your colon must be clear of any stool.   Inadequate preparation limits the value of this procedure and could necessitate  rescheduling of the examination.     *  If you need to reschedule or cancel your appointment, please provide a minimum of 7 days notice.  You can reach us at [phone] or [phone], Option 2.      FREQUENTLY ASKED QUESTIONS:    My procedure is in the afternoon. Can I eat in the morning?   A: No. To ensure your safety during the procedure, it is important that the stomach is empty.   Any food or liquid in the stomach at the   time of the procedure places you at risk of aspirating   these contents into the lung leading to a serious complication called aspiration pneumonia.   You can drink water until 4 hours before your procedure time.     I ate breakfast (lunch or dinner) the day before my colonoscopy. Is that okay?   A: If the preparation instructions were not followed properly, residual stool may remain in the   colon and hide important findings from the examining physician. In some cases, if the colon   preparation is not good, you may have to repeat the preparation and the exam. If you   accidentally eat any solid food the day before your exam, please call and ask to speak with a   member of the nursing staff. You may be asked to reschedule your procedure.    I don't have a ride. Is that okay?   A: No. If you do not have a responsible adult to accompany you home, YOUR PROCEDURE WILL BE CANCELLED.    How many days prior to the procedure should I discontinue my Coumadin, Plavix or  other blood thinning medications?   A: If you are taking any blood thinning medications please let your endoscopist know.   Generally speaking, you should quit taking your blood thinning medication 2-7 days prior to   some procedures depending on the medication; however, you must check with your   endoscopist and your prescribing physician to ensure that it is needed and safe for you to do so.    What medications can I take the day before and the day of my procedure?  A: The day prior to your procedure take your medications the way you normally would.   However, for those  patients taking any type of bowel cleansing preparation, be advised that   you may undergo a prolonged period of diarrhea that may flush oral medications out of your system before they have time to take effect. The morning of your procedure you should take any blood pressure or heart medications you may be on with a small sip of water. You can hold most other medications and take them once your procedure has been completed. If you have questions about specific medication(s), please call the provider who prescribed the medication.    I am diabetic. Do I take my insulin?   A: You must direct the question to the physician who placed you on this medication. Please  check your blood sugar the morning of your procedure as you normally would. If you have any questions about your diabetes management in conjunction with your fast for your endoscopic procedure, please consult your primary physician.     I am on pain medication? Can I take it prior to my procedure?   A: You may take your prescription pain medications prior to your procedure with a small sip of water. Please inform the pre-procedural clinical staff of any medications you've taken the day of your procedure. If you have any questions, please call a member of the endoscopy unit clinical staff.    I'm having a menstrual period. Should I reschedule my colonoscopy appointment?   A: No. Your menstrual period will not interfere with your physician's ability to complete your   procedure.     May I continue taking my iron tablets?   A: No. Iron may cause the formation of dark color stools which can make it difficult for the  physician to complete your colonoscopy if your   preparation is less than optimal. We   recommend you stop taking your oral iron supplements at least one week prior to your   procedure.     I have been on Aspirin therapy for my heart. Should I continue to take it?   A: Aspirin may affect blood coagulation. Please check with your endoscopist and  prescribing   physician.     I am having a colonoscopy tomorrow. I started my colon preparation on time but now I   am experiencing diarrhea and/or a bloating feeling. What should I do?   A: Nausea, vomiting and a sense of fullness or bloating can occur any time after beginning   your colon preparation. However, it is important that you drink all the preparation. For most   people, taking an hour break from the preparation will usually help. Then continue taking the  preparation as ordered. If the vomiting returns or symptoms get worse, please call your GI   physician's office.    If you have any questions or concerns, please don't hesitate to call.   Sincerely,  [facility]

## 2021-06-12 NOTE — Progress Notes (Signed)
NEW PATIENT VIDEO VISIT    Date of Visit: 06/12/2021 12:17 PM  Patient ID: Edward Kelly is a 41 y.o. male.    Referring Physician: Marga Melnick, FNP     Verbal consent has been obtained from the patient residing in [place] to conduct this video visit .  Reason for Consultation:     Chief Complaint   Patient presents with    Colon Cancer Screening     Family history of cancer. Mother died at 70 of adenocarcinoma.     No diarrhea or constipation, no bloating, ;; no dysphagia, no acid reflux, no abdominal pain.   CLN: neg hx  EGD: neg hx        History:   Edward Kelly is a 41 y.o.  [place]  male was referred to GI Clinic for fh gastric ca mother age 52. No other known family members but mothers line unknown. No known fh colon ca. He feels fine..      Problem List:     Patient Active Problem List   Diagnosis    Annual physical exam       Past Medical History:   History reviewed. No pertinent past medical history.    Past Surgical History:   History reviewed. No pertinent surgical history.    Current Medications:     No outpatient medications have been marked as taking for the 06/12/21 encounter (Telemedicine Visit) with Doreen Beam, MD.       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    Review of Systems:     Constitutional: Denies fever, chills or weight loss.  Eyes: Denies vision or eye problems.   ENMT: Denies hearing or ear problem, nasal discharge, oral lesions or sore throat.  Respiratory: Denies wheezing or cough.  Cardiovascular: heart murmer   Gastrointestinal: See HPI.  Genitourinary: Denies urinary symptoms or problems.  Musculoskeletal: Denies back or  joint pain or problem.  Neurologic: Denies slurred speech, hemiplegia or focal deficit.  Psychiatric: Denies depression or anxiety.   Integumentary:  Denies skin rashes or jaundice.    Vital Signs:   Ht 1.829 m (6')   Wt 88.5 kg (195 lb)   BMI 26.45 kg/m           06/03/2021    11:15 AM 06/12/2021    11:48 AM   Weight Monitoring   Height 180.3 cm 182.9 cm   Weight 89.812 kg 88.451 kg   BMI (calculated) 27.7 kg/m2 26.5 kg/m2        Physical Exam:   General: Well developed and well nourished, no acute distress.  Eyes: Sclera anicteric  Neurologic: Alert and oriented.  No obvious focal deficits.  Psychiatric: Cooperative, appropriate mood    Labs Reviewed:     Recent Labs     06/03/21  1133   WBC 4.46   Hgb 15.7   Hematocrit 45.5   Platelets 224       No results for input(s):  PT, INR in the last [id number] hours.    Recent Labs     06/03/21  1133   Sodium 140   Potassium 4.5   Chloride 102   CO2 29   BUN 14.0   Creatinine 0.9   Calcium 9.7   Albumin 4.5   Protein, Total 7.0   Bilirubin, Total 1.4*   Alkaline Phosphatase 60   ALT 29   AST (SGOT) 35   Glucose 89   TSH 0.83        Rads:   No results found.   No results found for this or any previous visit.     No results found for this or any previous visit.       GI Procedures:       Assessment/Plan:   1. Family history of stomach cancer   Comment:it is quite reasonable to obtain one egd check for hp and intestinal metaplasia.  We have reviewed the possibility of adding colonoscopy since he will already be sedated. He would like to add a colonoscopy which is quite reasonable    Plan: EGD with mapping biopsies and Colonoscopy with 1 day colyte split prep    All of the above has been reviewed at length with this patient including differential dx's, rationale for laboratory test, medications, procedures  including all benefits and risks. Rationale for above pros and cons reviewed at lenghth visit    Follow-up date to be determined/adjusted as needed based on the  diagnostic test/procedure results, therapeutic response, and symptoms.       It is a great pleasure to participate in the care of [facility].     Doreen Beam, MD

## 2021-06-16 ENCOUNTER — Telehealth (INDEPENDENT_AMBULATORY_CARE_PROVIDER_SITE_OTHER): Payer: Self-pay

## 2021-06-16 NOTE — Telephone Encounter (Signed)
Called pt to schedule egd/cln. No answer lvm.

## 2021-06-17 ENCOUNTER — Telehealth (INDEPENDENT_AMBULATORY_CARE_PROVIDER_SITE_OTHER): Payer: Self-pay

## 2021-06-17 NOTE — Telephone Encounter (Signed)
pt returned my call. I scheduled pt with Abuhamda 6/13 mtv @1 :45.

## 2021-06-24 ENCOUNTER — Other Ambulatory Visit (INDEPENDENT_AMBULATORY_CARE_PROVIDER_SITE_OTHER): Payer: Self-pay | Admitting: Gastroenterology

## 2021-06-24 ENCOUNTER — Encounter (INDEPENDENT_AMBULATORY_CARE_PROVIDER_SITE_OTHER): Payer: Self-pay

## 2021-06-24 DIAGNOSIS — Z800 Family history of malignant neoplasm of digestive organs: Secondary | ICD-10-CM | POA: Insufficient documentation

## 2021-07-03 ENCOUNTER — Encounter (HOSPITAL_BASED_OUTPATIENT_CLINIC_OR_DEPARTMENT_OTHER): Payer: Self-pay

## 2021-07-03 NOTE — Progress Notes (Signed)
[facility] [place] OFFICE CONSULTATION    I had the pleasure of seeing Mr. Edward Kelly today for cardiovascular evaluation. He is a pleasant 41 y.o. male     Initial visit 07/18/2021:  .....      In 2014, he was found to have a heart murmur in [place].  He underwent an echocardiogram and stress test.  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      He has been doing well and has a very healthy lifestyle.  He saw a new primary care physician who again heard a heart murmur and referred him for cardiac evaluation.  He does exercise regularly including weightlifting for 45 minutes.  He also does bench press up to 225 pounds.  He runs 5 to 6 miles a day.  He denies any cardiovascular symptoms.  He works for  [employer].  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  For the past few years, he is been having mostly desk job.    Overall he has no medical issues and has healthy diet.  He occasionally gets dizzy after high intensity running/exercise.    PAST MEDICAL HISTORY: He has a past medical history of Murmur, cardiac. He has no past surgical history on file.    MEDICATIONS:         Current Outpatient Medications:     polyethylene glycol w/ electrolytes (COLYTE/GAVILYTE-C) oral solution, Follow the bowel preparation instructions sent from your GI doctor's office., Disp: 4000 mL, Rfl: 0     ALLERGIES: No Known Allergies    FAMILY HISTORY: His family history includes Cancer in his mother; No known problems in his father.  Father had stroke   Mother no CAD passed from cancer   Two half brother with no heart issues  SOCIAL HISTORY: He reports that he has never smoked. He does not have any smokeless tobacco history on file. He reports current alcohol use. He reports that he does not use drugs.  No smoker   Drinks alcohol on weekends  No drugs.   Married, Two kids, 31 64 year old, a boy and a girl    He has plant-based diet during the weekdays and eats  fish or chicken on weekends.    PHYSICAL EXAMINATION  General Appearance:  A well-appearing male in no acute distress.    Vital Signs: BP 109/72 Comment: left  Pulse 80   Ht 1.829 m (6')   Wt 88 kg (194 lb)   SpO2 98%   BMI 26.31 kg/m    HEENT: Sclera anicteric, conjunctiva without pallor,   Neck:  Supple without jugular venous distention.   Chest: Clear to auscultation bilaterally with good air movement and respiratory effort and no wheezes, rales, or rhonchi   Cardiovascular: RRR, normal S1 and physiologically split S2, soft systolic murmurs, no gallops or rub.  Abdomen: Soft, nontender, nondistended,   Extremities: Warm without edema  Neuro: Alert Grossly intact. Strength is symmetrical.      LABS   Lab Results   Component Value Date    WBC 4.46 06/03/2021    HGB 15.7 06/03/2021    HCT 45.5 06/03/2021    PLT 224 06/03/2021    NA 140 06/03/2021    K 4.5 06/03/2021    BUN 14.0 06/03/2021    CREAT 0.9 06/03/2021    GLU 89 06/03/2021    AST 35 06/03/2021    ALT 29 06/03/2021  HGBA1C 4.9 06/03/2021    TSH 0.83 06/03/2021     Recent Labs     06/03/21  1133   Cholesterol 177   Triglycerides 43   HDL 61   LDL Calculated 107*         Cardiographics/Imagings:     ECG:       IMPRESSION/RECOMMENDATIONS: Mr. Edward Kelly is a 41 y.o. male   Borderline hyperlipidemia  Soft heart murmur  Was diagnosed with athletic heart.        Will proceed with echocardiogram to rule out any structural heart disease.  Exercise treadmill stress test to rule out ischemia and assess patient's hemodynamic response to exercise and functional capacity.  Continue with healthy diet and exercise.        This note was generated using the EPIC/Dragon speech recognition software and may contain inherent errors or omissions not intended by the user. Grammatical errors, random word insertions, deletions, pronoun errors and incomplete sentences are occasional consequences of this technology due to software limitations. Not all errors are caught or  corrected. If there are questions or concerns about the content of this note or information contained within the body of this dictation they should be addressed directly with the author for clarification.

## 2021-07-03 NOTE — Progress Notes (Signed)
Records requested from PCP.

## 2021-07-15 ENCOUNTER — Telehealth (HOSPITAL_BASED_OUTPATIENT_CLINIC_OR_DEPARTMENT_OTHER): Payer: Self-pay | Admitting: Internal Medicine

## 2021-07-15 NOTE — Telephone Encounter (Signed)
Patient:  Edward Kelly   Patient Phone Number:  Preferred: [phone] / Other: [phone] (home)     Appointment Date: 07/18/2021   Cardiologist: Craig Staggers, MD    Location: [facility] [place]    Diagnosis:      Referring MD:       Referring MD Phone Number:

## 2021-07-17 NOTE — Telephone Encounter (Signed)
All records in Epic.

## 2021-07-18 ENCOUNTER — Other Ambulatory Visit (INDEPENDENT_AMBULATORY_CARE_PROVIDER_SITE_OTHER): Payer: BLUE CROSS/BLUE SHIELD

## 2021-07-18 ENCOUNTER — Ambulatory Visit (HOSPITAL_BASED_OUTPATIENT_CLINIC_OR_DEPARTMENT_OTHER): Payer: BLUE CROSS/BLUE SHIELD | Admitting: Internal Medicine

## 2021-07-18 ENCOUNTER — Encounter (HOSPITAL_BASED_OUTPATIENT_CLINIC_OR_DEPARTMENT_OTHER): Payer: Self-pay | Admitting: Internal Medicine

## 2021-07-18 VITALS — BP 109/72 | HR 80 | Ht 72.0 in | Wt 194.0 lb

## 2021-07-18 DIAGNOSIS — R011 Cardiac murmur, unspecified: Secondary | ICD-10-CM

## 2021-07-18 DIAGNOSIS — R42 Dizziness and giddiness: Secondary | ICD-10-CM

## 2021-07-18 NOTE — Patient Instructions (Signed)
Echo   Stress test

## 2021-07-19 ENCOUNTER — Encounter (HOSPITAL_BASED_OUTPATIENT_CLINIC_OR_DEPARTMENT_OTHER): Payer: Self-pay | Admitting: Internal Medicine

## 2021-08-01 ENCOUNTER — Encounter (INDEPENDENT_AMBULATORY_CARE_PROVIDER_SITE_OTHER): Payer: Self-pay

## 2021-08-01 ENCOUNTER — Ambulatory Visit (INDEPENDENT_AMBULATORY_CARE_PROVIDER_SITE_OTHER): Payer: BLUE CROSS/BLUE SHIELD

## 2021-08-01 ENCOUNTER — Ambulatory Visit
Admission: RE | Admit: 2021-08-01 | Discharge: 2021-08-01 | Disposition: A | Discharge status: Home / Self Care | Payer: BLUE CROSS/BLUE SHIELD | Source: Ambulatory Visit | Attending: Internal Medicine | Admitting: Internal Medicine

## 2021-08-01 ENCOUNTER — Ambulatory Visit: Payer: BLUE CROSS/BLUE SHIELD

## 2021-08-01 DIAGNOSIS — R011 Cardiac murmur, unspecified: Secondary | ICD-10-CM

## 2021-08-01 DIAGNOSIS — R42 Dizziness and giddiness: Secondary | ICD-10-CM

## 2021-08-01 NOTE — PSS Phone Screening (Signed)
Pre-Anesthesia Evaluation    Pre-op phone visit requested by:   Reason for pre-op phone visit: Patient anticipating COLONOSCOPY, EGD procedure. Arrival time 1245.    No orders of the defined types were placed in this encounter.      History of Present Illness/Summary:        Problem List:  Medical Problems       Hospital Problem List  Date Reviewed: 07/19/2021   None        Non-Hospital Problem List  Date Reviewed: 07/19/2021            ICD-10-CM Priority Class Noted    Family history of stomach cancer Z80.0   06/24/2021        Medical History   Diagnosis Date    Hyperlipidemia     borderline    Murmur, cardiac      Past Surgical History:   Procedure Laterality Date    APPENDECTOMY (OPEN)      laparoscopic        Medication List            Accurate as of August 01, 2021  3:49 PM. Always use your most recent med list.                polyethylene glycol w/ electrolytes oral solution  Follow the bowel preparation instructions sent from your GI doctor's office.  Commonly known as: COLYTE/GAVILYTE-C  Medication Adjustments for Surgery: Take as prescribed     UNABLE TO FIND  daily Med Name:Optimen supplement  Medication Adjustments for Surgery: Stop 7 days before surgery            No Known Allergies  Family History   Problem Relation Age of Onset    Cancer Mother     No known problems Father      Social History     Occupational History    Not on file   Tobacco Use    Smoking status: Never    Smokeless tobacco: Never   Vaping Use    Vaping status: Never Used   Substance and Sexual Activity    Alcohol use: Yes     Alcohol/week: 6.0 standard drinks of alcohol     Types: 6 Cans of beer per week     Comment: 6 beer per week    Drug use: Never    Sexual activity: Not on file             Exam Scores:   SDB score Risk Category: No Risk    PONV score      MST score MST Score: 0    Allergy score      Frailty score         Visit Vitals  Ht 1.829 m (6')   Wt 88.5 kg (195 lb)   BMI 26.45 kg/m       Recent Labs   CBC (last 180 days)  06/03/21  1133   WBC 4.46   RBC 5.24   Hgb 15.7   Hematocrit 45.5   MCV 86.8   MCH 30.0   MCHC 34.5   RDW 12   Platelets 224   MPV 11.0   Nucleated RBC 0.0   Absolute NRBC 0.00     Recent Labs   BMP (last 180 days) 06/03/21  1133   Glucose 89   BUN 14.0   Creatinine 0.9   Sodium 140   Potassium 4.5   Chloride 102   CO2 29  Calcium 9.7   Anion Gap 9.0   EGFR >60.0         Recent Labs   Other (last 180 days) 06/03/21  1133   TSH 0.83   ALT 29   AST (SGOT) 35   Protein, Total 7.0   Hemoglobin A1C 4.9

## 2021-08-05 LAB — ECHOCARDIOGRAM ADULT COMPLETE W CLR/ DOPP WAVEFORM
AV Area (Cont Eq VTI): 3.516
AV Area (Cont Eq VTI): 3.58
AV Mean Gradient: 3
AV Mean Gradient: 3
AV Peak Velocity: 108
AV Peak Velocity: 114
Ao Root Diameter (2D): 3.7
BP Mod LV Ejection Fraction: 67.3
IVS Diastolic Thickness (2D): 0.778
IVS Diastolic Thickness (2D): 0.951
LA Dimension (2D): 3.7
LA Volume Index (BP A-L): 0.031
LVID diastole (2D): 5.33
LVID systole (2D): 3.38
MV Area (PHT): 3.059
MV E/A: 1.3
MV E/A: 1.338
MV E/e' (Average): 5.14
Mitral Valve Findings: NORMAL
Prox Ascending Aorta Diameter: 3.4
Pulmonary Valve Findings: NORMAL
RV Basal Diastolic Dimension: 4.05
RV Basal Diastolic Dimension: 4.19
RV Function: NORMAL
RV Systolic Pressure: 13.758
RV Systolic Pressure: 14
Site RA Size (AS): NORMAL
TAPSE: 2.26
Tricuspid Valve Findings: NORMAL

## 2021-08-06 NOTE — Addendum Note (Signed)
Addended by: Craig Staggers on: 08/06/2021 10:24 AM     Modules accepted: Orders

## 2021-08-07 ENCOUNTER — Inpatient Hospital Stay: Admission: RE | Admit: 2021-08-07 | Payer: Self-pay | Source: Ambulatory Visit

## 2021-08-11 ENCOUNTER — Telehealth (INDEPENDENT_AMBULATORY_CARE_PROVIDER_SITE_OTHER): Payer: Self-pay | Admitting: Gastroenterology

## 2021-08-11 NOTE — Telephone Encounter (Signed)
Patient called to confirm procedure on 6/13/prep instructions. Patient was informed to be on clear liquids the whole day and no solid food.

## 2021-08-12 ENCOUNTER — Encounter: Payer: Self-pay | Admitting: Gastroenterology

## 2021-08-12 ENCOUNTER — Ambulatory Visit: Payer: BLUE CROSS/BLUE SHIELD | Admitting: Anesthesiology

## 2021-08-12 ENCOUNTER — Encounter
Admission: RE | Disposition: A | Discharge status: Home / Self Care | Payer: Self-pay | Source: Ambulatory Visit | Attending: Gastroenterology

## 2021-08-12 ENCOUNTER — Ambulatory Visit
Admission: RE | Admit: 2021-08-12 | Discharge: 2021-08-12 | Disposition: A | Discharge status: Home / Self Care | Payer: BLUE CROSS/BLUE SHIELD | Source: Ambulatory Visit | Attending: Gastroenterology | Admitting: Gastroenterology

## 2021-08-12 ENCOUNTER — Ambulatory Visit: Payer: Self-pay

## 2021-08-12 DIAGNOSIS — Z1212 Encounter for screening for malignant neoplasm of rectum: Secondary | ICD-10-CM

## 2021-08-12 DIAGNOSIS — Z1211 Encounter for screening for malignant neoplasm of colon: Secondary | ICD-10-CM | POA: Insufficient documentation

## 2021-08-12 DIAGNOSIS — K648 Other hemorrhoids: Secondary | ICD-10-CM | POA: Insufficient documentation

## 2021-08-12 DIAGNOSIS — Z800 Family history of malignant neoplasm of digestive organs: Secondary | ICD-10-CM | POA: Insufficient documentation

## 2021-08-12 DIAGNOSIS — K2950 Unspecified chronic gastritis without bleeding: Secondary | ICD-10-CM | POA: Insufficient documentation

## 2021-08-12 HISTORY — PX: EGD, BIOPSY: SHX3796

## 2021-08-12 HISTORY — PX: COLONOSCOPY: SHX174

## 2021-08-12 SURGERY — DONT USE, USE 1094-COLONOSCOPY, DIAGNOSTIC (SCREENING)
Anesthesia: Anesthesia General | Site: Mouth | Wound class: Clean Contaminated

## 2021-08-12 MED ORDER — PROPOFOL 200 MG/20ML IV EMUL
INTRAVENOUS | Status: DC | PRN
Start: 2021-08-12 — End: 2021-08-12
  Administered 2021-08-12: 100 mg via INTRAVENOUS

## 2021-08-12 MED ORDER — PROPOFOL INFUSION 10 MG/ML
INTRAVENOUS | Status: DC | PRN
Start: 2021-08-12 — End: 2021-08-12
  Administered 2021-08-12: 150 ug/kg/min via INTRAVENOUS

## 2021-08-12 MED ORDER — LIDOCAINE HCL 2 % IJ SOLN
INTRAMUSCULAR | Status: DC | PRN
Start: 2021-08-12 — End: 2021-08-12
  Administered 2021-08-12: 100 mg via INTRAVENOUS

## 2021-08-12 MED ORDER — LIDOCAINE HCL (PF) 2 % IJ SOLN
INTRAMUSCULAR | Status: AC
Start: 2021-08-12 — End: ?
  Filled 2021-08-12: qty 5

## 2021-08-12 MED ORDER — PROPOFOL 10 MG/ML IV EMUL (WRAP)
INTRAVENOUS | Status: AC
Start: 2021-08-12 — End: ?
  Filled 2021-08-12: qty 20

## 2021-08-12 MED ORDER — FENTANYL CITRATE (PF) 50 MCG/ML IJ SOLN (WRAP)
INTRAMUSCULAR | Status: AC
Start: 2021-08-12 — End: ?
  Filled 2021-08-12: qty 1

## 2021-08-12 MED ORDER — FENTANYL CITRATE (PF) 50 MCG/ML IJ SOLN (WRAP)
INTRAMUSCULAR | Status: DC | PRN
Start: 2021-08-12 — End: 2021-08-12
  Administered 2021-08-12 (×2): 25 ug via INTRAVENOUS

## 2021-08-12 MED ORDER — PROPOFOL 10 MG/ML IV EMUL (WRAP)
INTRAVENOUS | Status: AC
Start: 2021-08-12 — End: ?
  Filled 2021-08-12: qty 50

## 2021-08-12 MED ORDER — LACTATED RINGERS IV SOLN
INTRAVENOUS | Status: DC
Start: 2021-08-12 — End: 2021-08-12

## 2021-08-12 SURGICAL SUPPLY — 106 items
BALLOON FEEDING 24FR X 4.4CM (Endoscopic Supplies) IMPLANT
BELT RB FM UNV 40X6IN LF NS SLD PNL ELC (Patient Supply)
BELT RIB UNIVERSAL L40 IN X W6 IN FOAM DEROYAL SOLID PANEL ELASTIC (Patient Supply) IMPLANT
BLOCK BITE MAXI 60FR LF STRD STRAP SDPRT (Procedure Accessories) ×1
BLOCK BITE OD60 FR STURDY STRAP SIDEPORT (Procedure Accessories) ×2
BLOCK BITE OD60 FR STURDY STRAP SIDEPORT DENTAL RETENTION RIM MAXI (Procedure Accessories) ×2 IMPLANT
BRUSH CYTO CNMD 3MM 2.1MM 230CM LF STRL (Brushes)
BRUSH CYTOLOGY 230CM SHTH BRSTL PSTV RING 3MM 2.1MM CONMED COLONOSCOPE (Brushes) IMPLANT
BRUSH CYTOLOGY L230 CM SHEATH BRISTLE (Brushes)
CATHETER BALLOON DILATATION CRE 2.8 MM (Balloons)
CATHETER BALLOON DILATATION CRE PEBAX (Balloons)
CATHETER BALLOON DILATATION L5.5 CM L240 (Balloons)
CATHETER BLNDIL PEBAX CRE 7.5FR 12-15MM (Balloons)
CATHETER BLNDIL PEBAX CRE 7.5FR 15-18MM (Balloons)
CATHETER ELHMST HMGLD GLDPRB 7FR 300CM (Procedure Accessories)
CATHETER OD10-11-12 MM ODSEC6 FR L180 CM CRE BALLOON DILATATION L8 CM (Balloons) IMPLANT
CATHETER OD10-11-12 MM ODSEC6 FR L180 CM CREâ„¢ BALLOON DILATATION L8 CM (Balloons) IMPLANT
CATHETER OD15-16.5-18 MM ODSEC6 FR L180 CM CRE BALLOON DILATATION L8 (Balloons) IMPLANT
CATHETER OD15-16.5-18 MM ODSEC6 FR L180 CM CREâ„¢ BALLOON DILATATION L8 (Balloons) IMPLANT
CATHETER OD6 FR ODSEC12-13.5-15 MM L180 CM CRE BALLOON DILATATION L8 (Balloons) IMPLANT
CATHETER OD6 FR ODSEC12-13.5-15 MM L180 CM CREâ„¢ BALLOON DILATATION L8 (Balloons) IMPLANT
CATHETER OD7 FR L300 CM BIPOLAR ROUND (Procedure Accessories)
CATHETER OD7 FR L300 CM BIPOLAR ROUND DISTAL TIP STANDARD CONNECTOR (Procedure Accessories) IMPLANT
CATHETER OD7.5 FR ODSEC12-15 MM L240 CM CRE BALLOON DILATATION L5.5 (Balloons) IMPLANT
CATHETER OD7.5 FR ODSEC12-15 MM L240 CM CREâ„¢ BALLOON DILATATION L5.5 (Balloons) IMPLANT
CATHETER OD7.5 FR ODSEC15-18 MM L240 CM CRE BALLOON DILATATION L5.5 (Balloons) IMPLANT
CATHETER OD7.5 FR ODSEC15-18 MM L240 CM CREâ„¢ BALLOON DILATATION L5.5 (Balloons) IMPLANT
CATHETER OD8-9-10 MM ODSEC7.5 FR L240 CM CRE BALLOON DILATATION L5.5 (Balloons) IMPLANT
CATHETER OD8-9-10 MM ODSEC7.5 FR L240 CM CREâ„¢ BALLOON DILATATION L5.5 (Balloons) IMPLANT
DILATOR ENDOSCOPIC CRE 2.8 MM 3.2 MM (Balloons)
DILATOR ENDOSCOPIC CRE 2.8 MM 3.2 MM PEBAX ESOPHAGEAL PYLORIC COLONIC (Balloons) IMPLANT
DILATOR ENDOSCOPIC CRE 5.5C 240CM 6-7-8MM 7.5FR PEBAX ESOPHAGEAL (Balloons) IMPLANT
DILATOR ENDOSCOPIC CRE PEBAX ESOPHAGEAL (Balloons)
DILATOR ESCP PEBAX 2.8MM 3.2MM CRE (Balloons)
DILATOR ESCP PEBAX 2.8MM 3.2MM CRE 6-7-8 (Balloons)
DILATOR ESCP PEBAX 2.8MM CRE 10-11-12MM (Balloons)
DILATOR ESCP PEBAX 2.8MM CRE 15-16.5-18 (Balloons)
DILATOR ESCP PEBAX CRE 6FR 12-13.5-15MM (Balloons)
DILATOR ESCP PEBAX CRE 8-9-10MM 7.5FR (Balloons)
ELECTRODE ELECTROSURGICAL 168 SQ CM L170 (Procedure Accessories)
ELECTRODE ELECTROSURGICAL 168 SQ CM L170 MM NESSY HYDROGEL SPLIT (Procedure Accessories) IMPLANT
ELECTRODE ESURG DDRGL 168SQ CM NESSY 170 (Procedure Accessories)
FORCEPS BIOPSY L240 CM +2.8 MM HOT OD2.2 (Endoscopic Supplies)
FORCEPS BIOPSY L240 CM +2.8 MM HOT OD2.2 MM RADIAL JAW (Endoscopic Supplies) IMPLANT
FORCEPS BIOPSY L240 CM JUMBO MICROMESH (Instrument)
FORCEPS BIOPSY L240 CM JUMBO MICROMESH TEETH STREAMLINE CATHETER (Instrument) IMPLANT
FORCEPS BIOPSY L240 CM LARGE CAPACITY (Instrument)
FORCEPS BIOPSY L240 CM MICROMESH TEETH STREAMLINE CATHETER NEEDLE (Instrument) IMPLANT
FORCEPS BIOPSY L240 CM STANDARD CAPACITY (Instrument) ×2
FORCEPS BX +2.8MM RJ 4 2.2MM 240CM HOT (Endoscopic Supplies)
FORCEPS BX SS JMB RJ 4 2.8MM 240CM STRL (Instrument)
FORCEPS BX SS LG CPC RJ 4 2.4MM 240CM (Instrument)
FORCEPS BX STD CPC RJ 4 2.2MM 240CM STRL (Instrument) ×1
GOWN CHEMOTHERAPY L47 IN X W28 IN UNIVERSAL OPEN BACK OVERHEAD KNIT (Gown) ×4 IMPLANT
GOWN CHMO PP PE UNV 47X28IN LF OPN BCK (Gown) ×6
GUIDEWIRE ENDOSCOPIC L200 CM (Guidwire)
GUIDEWIRE ENDOSCOPIC SAVARY-GILLIARD L200 CM FLEXIBLE TIP STAINLESS (Guidwire) IMPLANT
GUIDEWIRE URO SS S-G 200CM NS FLX TP (Guidwire)
JELLY LUB EZ LF STRL H2O SOL NGRS TRNLU (Irrigation Solutions) ×3 IMPLANT
KIT ENDO W/ FOUR PACK BUTTONS (Kits) ×1
KIT ENDOSCOPIC COMPLIANCE ENDOKIT (Kits) ×2
KIT ENDOSCOPIC COMPLIANCE ENDOKIT ORCAPOD 4 1.1 OZ (Kits) ×2 IMPLANT
KIT IRR AQUASHIELD ENDOGATOR 230CM TUBE (Kits) ×3 IMPLANT
KIT UNIVERSAL IRRIGATION SOL (Kits) ×3 IMPLANT
LIGATOR DEVICE ENDOSCOPIC (Disposable Instruments)
LIGATOR ESCP LF STRL DEV DISP (Disposable Instruments)
LIGATOR POLYLOOP DEVICE ENDOSCOPIC (Disposable Instruments) IMPLANT
MARKER ENDOSCOPIC PERMANENT INDICATION (Syringes, Needles) ×2
MARKER ENDOSCOPIC PERMANENT INDICATION DARK SYRINGE SPOT EX 5 ML (Syringes, Needles) ×2 IMPLANT
MARKER ESCP 5ML SPOT EX PERM INDCT DRK (Syringes, Needles) ×1
MASK FACE FM FLDSHLD LF LVL 3 TIE EYSHLD (Personal Protection) IMPLANT
NEEDLE SCLEROTHERAPY CARR-LOCKE OD25 GA ODSEC2.5 MM L230 CM INJECTION (Needles) IMPLANT
NEEDLE SCLEROTHERAPY OD25 GA ODSEC2.5 MM (Needles)
NEEDLE SCLRTX SS TFLN CRLK 25GA 2.5MM (Needles)
NET ROTH FOREIGN BODY MAXI GI (Disposable Instruments) IMPLANT
NET ROTH FOREIN BODY STNDRD GI (Disposable Instruments) IMPLANT
NET SPEC RTRVL STD RTHNT 2.5MM 230CM LF (Urology Supply)
NET SPECIMEN RETRIEVAL L230 CM STANDARD (Urology Supply)
NET SPECIMEN RETRIEVAL L230 CM STANDARD SHEATH OD2.5 MM L6 CM X W3 CM (Urology Supply) IMPLANT
OVERTUBE ENDOSCOPIC L25 CM STANDARD (Endoscopic Supplies)
OVERTUBE ENDOSCOPIC L25 CM STANDARD TAPER INSUFFLATION CAP OD19.5 MM (Endoscopic Supplies) IMPLANT
OVERTUBE ESCP STD TPR GUARDUS 19.5MM (Endoscopic Supplies)
PROBE COAG FIAPC 6.9FR 7.2FT CRCMF PLG (Endoscopic Supplies)
PROBE COAGULATION L7.2 FT (Endoscopic Supplies)
PROBE COAGULATION L7.2 FT CIRCUMFERENTIAL PLUG PLAY FUNCTIONALITY (Endoscopic Supplies) IMPLANT
PROBE ELECTROSURGICAL L220 CM FLEXIBLE (Procedure Accessories)
PROBE ELECTROSURGICAL L220 CM FLEXIBLE STRAIGHT FIRE OD2.3 MM FIAPC (Procedure Accessories) IMPLANT
PROBE ESURG FIAPC 2.3MM 220CM STRL FLXB (Procedure Accessories)
SNARE ESCP MED OVL SNS 2.4MM 240CM STRL (Procedure Accessories)
SNARE ESCP SPRL SNAREMASTER 2.8MM 20MM (Procedure Accessories)
SNARE MEDIUM OVAL L240 CM OD2.4 MM (Procedure Accessories)
SNARE MEDIUM OVAL L240 CM OD2.4 MM SENSATION LOOP SHORT THROW FLEXIBLE (Procedure Accessories) IMPLANT
SNARE SPIRAL 230CM 2.8MM 20MM SNRMSTR RDG WIRE ENDOSCOPIC POLYPECTOMY (Procedure Accessories) IMPLANT
SNARE SPIRAL L230 CM OD2.8 MM ODSEC20 MM (Procedure Accessories)
SPONGE GAUZE L4 IN X W4 IN 16 PLY (Dressing) ×2
SPONGE GAUZE L4 IN X W4 IN 16 PLY MAXIMUM ABSORBENT USP TYPE VII (Dressing) ×2 IMPLANT
SPONGE GZE CTTN CRTY 4X4IN LF NS 16 PLY (Dressing) ×1
SYRINGE 50 ML GRADUATE NONPYROGENIC DEHP (Syringes, Needles) ×2
SYRINGE 50 ML GRADUATE NONPYROGENIC DEHP FREE PVC FREE BD MEDICAL (Syringes, Needles) ×2 IMPLANT
SYRINGE MED 50ML LF STRL GRAD N-PYRG (Syringes, Needles) ×1
TRAP MCS PLS LF STRL SCR CAP TUBE ID LBL (Procedure Accessories)
TRAP MUCUS SCREW CAP TUBE ID LABEL (Procedure Accessories)
TRAP MUCUS SCREW CAP TUBE ID LABEL MEDLINE PLASTIC CLEAR (Procedure Accessories) IMPLANT
WATER STERILE PLASTIC POUR BOTTLE 1000 (Irrigation Solutions) ×2
WATER STERILE PLASTIC POUR BOTTLE 1000 ML (Irrigation Solutions) ×2 IMPLANT
WATER STRL 1000ML LF PLS PR BTL (Irrigation Solutions) ×1

## 2021-08-12 NOTE — H&P (Signed)
GI PRE PROCEDURE NOTE    Proceduralist Comments:   Review of Systems and Past Medical / Surgical History performed: Yes     Indications: fam h/o stomach cancer and Colon cancer screening    Previous Adverse Reaction to Anesthesia or Sedation (if yes, describe): No    Physical Exam / Laboratory Data (If applicable)   General: Alert and cooperative  Lungs: Lungs clear to auscultation  Cardiac: RRR, normal S1S2.    Abdomen: Soft, non tender. Normal active bowel sounds  Other:     Outside labs reviewed    American Society of Anesthesiologists (ASA) Physical Status Classification:   Anesthesia ASA Score:           Planned Sedation:   Deep sedation with anesthesia    Attestation:   Edward Kelly has been reassessed immediately prior to the procedure and is an appropriate candidate for the planned sedation and procedure. Risks, benefits and alternatives to the planned procedure and sedation have been explained to the patient or guardian:  yes        Signed by: Alwyn Ren, MD

## 2021-08-12 NOTE — Anesthesia Postprocedure Evaluation (Signed)
Anesthesia Post Evaluation    Patient: Edward Kelly    Procedure(s):  COLONOSCOPY  EGD, BIOPSY    Anesthesia type: general    Last Vitals:   Vitals Value Taken Time   BP 95/53 08/12/21 1440   Temp 36.4 C (97.6 F) 08/12/21 1436   Pulse 54 08/12/21 1440   Resp 16 08/12/21 1436   SpO2 98 % 08/12/21 1440                 Anesthesia Post Evaluation:     Patient Evaluated: PACU  Patient Participation: complete - patient participated  Level of Consciousness: awake and alert  Pain Score: 0  Pain Management: adequate  Multimodal analgesia pain management approach    Airway Patency: patent    Anesthetic complications: No      PONV Status: none    Cardiovascular status: acceptable and hemodynamically stable  Respiratory status: acceptable and room air  Hydration status: acceptable  Comments: No apparent anesthetic complications or reportable events        Signed by: Manus Rudd, MD, 08/12/2021 3:00 PM

## 2021-08-12 NOTE — Transfer of Care (Signed)
Anesthesia Transfer of Care Note    Patient: Edward Kelly    Procedures performed: Procedure(s):  COLONOSCOPY  EGD, BIOPSY    Anesthesia type: General TIVA    Patient location:Phase II PACU    Last vitals:   Vitals:    08/12/21 1436   BP: 92/51   Pulse: 60   Resp: 16   Temp: 36.4 C (97.6 F)   SpO2: 97%       Post pain: Patient not complaining of pain, continue current therapy      Mental Status:awake    Respiratory Function: tolerating room air    Cardiovascular: stable    Nausea/Vomiting: patient not complaining of nausea or vomiting    Hydration Status: adequate    Post assessment: no apparent anesthetic complications, no reportable events and no evidence of recall    Signed by: Raynaldo Opitz, CRNA  08/12/21 2:36 PM

## 2021-08-12 NOTE — Anesthesia Preprocedure Evaluation (Signed)
Anesthesia Evaluation    AIRWAY    Mallampati: II    TM distance: >3 FB  Neck ROM: full  Mouth Opening:full   CARDIOVASCULAR    cardiovascular exam normal, regular and normal       DENTAL    no notable dental hx               PULMONARY    pulmonary exam normal and clear to auscultation     OTHER FINDINGS                                      Relevant Problems   No relevant active problems               Anesthesia Plan    ASA 2     general               (Appropriately NPO.      Past Medical History:  No date: Hyperlipidemia      Comment:  borderline  No date: Murmur, cardiac    Lab Results       Component                Value               Date                       WBC                      4.46                06/03/2021                 HGB                      15.7                06/03/2021                 HCT                      45.5                06/03/2021                 MCV                      86.8                06/03/2021                 PLT                      224                 06/03/2021              No results found for: COVID    Discussed with patient IVGA/deep sedation as indicated with risk to include but not limited to N/V, H/A, sore throat.  Questions answered. )      intravenous induction   Detailed anesthesia plan: general IV  Monitors/Adjuncts: other (Blood pressure cuff, Pulse Oximeter, EKG, ETCO2)    Post Op: other (Endo PACU)    Post op  pain management: per surgeon    informed consent obtained    Plan discussed with CRNA.      pertinent labs reviewed             Signed by: Manus RuddAyman Abdel-Rahman, MD 08/12/21 1:18 PM

## 2021-08-13 ENCOUNTER — Encounter: Payer: Self-pay | Admitting: Gastroenterology

## 2021-08-18 LAB — LAB USE ONLY - HISTORICAL SURGICAL PATHOLOGY

## 2021-08-21 ENCOUNTER — Encounter (INDEPENDENT_AMBULATORY_CARE_PROVIDER_SITE_OTHER): Payer: Self-pay

## 2021-08-21 ENCOUNTER — Telehealth (INDEPENDENT_AMBULATORY_CARE_PROVIDER_SITE_OTHER): Payer: Self-pay

## 2021-08-21 NOTE — Telephone Encounter (Signed)
Left a voicemail and a MyChart message to schedule a post procedure follow up with Dr. Durwin Nora per Dr. Lauralee Evener request.

## 2021-10-11 LAB — ECG 12-LEAD
Atrial Rate: 73 {beats}/min
IHS MUSE NARRATIVE AND IMPRESSION: NORMAL
P Axis: -10 degrees
P-R Interval: 174 ms
Q-T Interval: 374 ms
QRS Duration: 76 ms
QTC Calculation (Bezet): 412 ms
R Axis: -18 degrees
T Axis: 13 degrees
Ventricular Rate: 73 {beats}/min

## 2021-10-19 NOTE — Progress Notes (Signed)
[facility] [place] OFFICE VISIT      REASON FOR OFFICE VISIT:  Follow-up    HISTORY OF PRESENT ILLNESS:  I had the pleasure of seeing Edward Kelly today for cardiovascular evaluation. He is a pleasant 41 y.o. male who presents for a 7-month follow-up visit.  He was last seen the office by Dr. Ginger Organ on 07/18/2021.  He had echocardiogram and stress test done and is here to discuss his results.    Cardiac history:  Borderline hyperlipidemia  Heart Murmur    PAST MEDICAL HISTORY: He has a past medical history of Hyperlipidemia and Murmur, cardiac. He has a past surgical history that includes APPENDECTOMY (OPEN); Colonoscopy (N/A, 08/12/2021); and EGD, BIOPSY (N/A, 08/12/2021).    MEDICATIONS:   Current Outpatient Medications:     polyethylene glycol w/ electrolytes (COLYTE/GAVILYTE-C) oral solution, Follow the bowel preparation instructions sent from your GI doctor's office. (Patient not taking: Reported on 08/01/2021), Disp: 4000 mL, Rfl: 0    UNABLE TO FIND, daily Med Name:Optimen supplement, Disp: , Rfl:      ALLERGIES: No Known Allergies      REVIEW OF SYSTEMS: All other systems reviewed and negative except as above.    PHYSICAL EXAMINATION    Vital Signs: BP 113/72 (BP Site: Right arm, Patient Position: Sitting)   Pulse 60   Ht 1.829 m (6')   Wt 90.7 kg (200 lb)   SpO2 98%   BMI 27.12 kg/m      General Appearance: A well-appearing male in no acute distress.   HEENT: Sclera anicteric, conjunctiva without pallor, moist mucous membranes.  Neck: Supple without jugular venous distention.   Chest: Clear to auscultation bilaterally with good air movement and respiratory effort and no wheezes, rales, or rhonchi.  Cardiovascular: Normal S1 and S2,  faint SEM.    Abdomen: Soft, nontender. No organomegaly.  No pulsatile masses or bruits.    Extremities: Warm without edema.  Neuro: Alert and oriented x3. Grossly intact. Strength is symmetrical. Normal mood and affect.     Echocardiogram 08/01/2021  Summary    *  The left ventricle is normal in size.    * There is normal left ventricular geometry.    * Left ventricular systolic function is normal with an ejection fraction by Biplane Method of Discs of  67 %.    * Left ventricular segmental wall motion is normal.    * Left ventricular diastolic filling parameters demonstrate normal diastolic function.    * The right ventricular cavity size is mildly dilated.    * Normal right ventricular systolic function.    * RV basal diameter =  4.19 cm.    * TAPSE = 2.3 cm, S' = 15 cm/s, fractional area change = 50 %.    * There is trace to mild mitral regurgitation.    * There is trace tricuspid regurgitation.    * No pulmonary hypertension with estimated right ventricular systolic pressure of  14 mmHg.    * The IVC is normal in size with > 50% respiratory variance consistent with normal RA pressure of 3 mmHg.    * No pericardial effusion visualized.    * No prior study is available for comparison.      Stress Test Summary 08/01/2021    * Normal treadmill ECG stress test without evidence of exercise-induced myocardial ischemia.    * Normal resting blood pressure with normal response to exercise.    * Above average exercise capacity for age  and gender.       LABS:   Lab Results   Component Value Date    WBC 4.46 06/03/2021    HGB 15.7 06/03/2021    HCT 45.5 06/03/2021    PLT 224 06/03/2021    NA 140 06/03/2021    K 4.5 06/03/2021    BUN 14.0 06/03/2021    CREAT 0.9 06/03/2021    GLU 89 06/03/2021    CHOL 177 06/03/2021    TRIG 43 06/03/2021    HDL 61 06/03/2021    LDL 107 (H) 06/03/2021    AST 35 06/03/2021    ALT 29 06/03/2021    HGBA1C 4.9 06/03/2021    TSH 0.83 06/03/2021        IMPRESSION/RECOMMENDATIONS: Edward Kelly is a 41 y.o. malewho presents for a 42-month follow-up visit.  He was last seen  in the office by Dr. Ginger Organ on 07/18/2021 for a cardiac evaluation.  He had a normal exercise stress test on 08/01/2021 which showed no evidence of exercise-induced myocardial ischemia.  An  echocardiogram on 08/05/2021 showed no significant valvular disease and a mildly dilated RV.  This could be due to technical variation of the study so a repeat limited 3D echo was ordered to assess his RV, which the patient has not yet completed.    Encounter to discuss test results.  Mildly dilated RV.  Noted on echo 07/2021.      Limited echocardiogram with 3D assess RV.  Will f/u with test results.     Thank you for allowing our team to care of Edward Kelly.  Please contact us if you have any questions.    Ames Dura, FNP-BC  [facility] [place]

## 2021-10-20 ENCOUNTER — Encounter (HOSPITAL_BASED_OUTPATIENT_CLINIC_OR_DEPARTMENT_OTHER): Payer: Self-pay

## 2021-10-20 ENCOUNTER — Ambulatory Visit (HOSPITAL_BASED_OUTPATIENT_CLINIC_OR_DEPARTMENT_OTHER): Payer: BLUE CROSS/BLUE SHIELD

## 2021-10-20 VITALS — BP 113/72 | HR 60 | Ht 72.0 in | Wt 200.0 lb

## 2021-10-20 DIAGNOSIS — I517 Cardiomegaly: Secondary | ICD-10-CM | POA: Insufficient documentation

## 2021-10-20 DIAGNOSIS — Z712 Person consulting for explanation of examination or test findings: Secondary | ICD-10-CM | POA: Insufficient documentation

## 2021-10-22 ENCOUNTER — Encounter (INDEPENDENT_AMBULATORY_CARE_PROVIDER_SITE_OTHER): Payer: Self-pay | Admitting: Gastroenterology

## 2021-10-22 ENCOUNTER — Telehealth (INDEPENDENT_AMBULATORY_CARE_PROVIDER_SITE_OTHER): Payer: BLUE CROSS/BLUE SHIELD | Admitting: Gastroenterology

## 2021-10-22 VITALS — Ht 72.0 in | Wt 200.0 lb

## 2021-10-22 DIAGNOSIS — Z800 Family history of malignant neoplasm of digestive organs: Secondary | ICD-10-CM

## 2021-10-22 NOTE — Progress Notes (Signed)
NEW PATIENT VIDEO VISIT    Date of Visit: 10/22/2021 12:03 PM  Patient ID: Edward Kelly is a 41 y.o. male.    Referring Physician:       Verbal consent has been obtained from the patient residing in [place] to conduct this video visit .  Reason for Consultation:     Chief Complaint   Patient presents with    EGD     Follow up - results     Colonoscopy     Follow up- results        History:   Edward Kelly is a 41 y.o.  [place]  male was referred to GI Clinic for f/u. Happily colonoscopy without polyps egd negative bxes neg intestinal metaplasia neg dysplasia neg Hpylori. .      Problem List:     Patient Active Problem List   Diagnosis    Family history of stomach cancer    Encounter to discuss test results    Enlarged RV (right ventricle)       Past Medical History:     Past Medical History:   Diagnosis Date    Hyperlipidemia     borderline    Murmur, cardiac        Past Surgical History:     Past Surgical History:   Procedure Laterality Date    APPENDECTOMY (OPEN)      laparoscopic    COLONOSCOPY N/A 08/12/2021    Procedure: COLONOSCOPY;  Surgeon: Alwyn Ren, MD;  Location: [facility];  Service: Gastroenterology;  Laterality: N/A;    EGD, BIOPSY N/A 08/12/2021    Procedure: EGD, BIOPSY;  Surgeon: Alwyn Ren, MD;  Location: [facility];  Service: Gastroenterology;  Laterality: N/A;       Current Medications:     No outpatient medications have been marked as taking for the 10/22/21 encounter (Telemedicine Visit) with Doreen Beam, MD.       Allergies:   No Known Allergies    Family History:     Family History   Problem Relation Age of Onset    Cancer Mother     No known problems Father        Social History:     Social History     Tobacco Use    Smoking status: Never    Smokeless tobacco: Never   Vaping Use    Vaping Use: Never used   Substance Use Topics    Alcohol use: Yes     Alcohol/week: 6.0 standard drinks of alcohol     Types: 6 Cans of beer per week     Comment: 6 beer  per week    Drug use: Never       Review of Systems:         Vital Signs:   Ht 1.829 m (6')   Wt 90.7 kg (200 lb)   BMI 27.12 kg/m           08/12/2021    12:56 PM 10/20/2021     2:08 PM 10/22/2021    11:17 AM   Weight Monitoring   Height 182.9 cm 182.9 cm 182.9 cm   Height Method Stated     Weight 88.451 kg 90.719 kg 90.719 kg   Weight Method Stated     BMI (calculated) 26.5 kg/m2 27.2 kg/m2 27.2 kg/m2        Physical Exam:   General: Well developed and well nourished, no acute distress.  Eyes: Sclera anicteric  Neurologic: Alert and oriented.  No obvious focal deficits.  Psychiatric: Cooperative, appropriate mood    Labs Reviewed:     Recent Labs     06/03/21  1133   WBC 4.46   Hgb 15.7   Hematocrit 45.5   Platelets 224       No results for input(s): "PT", "INR" in the last [id number] hours.    Recent Labs     06/03/21  1133   Sodium 140   Potassium 4.5   Chloride 102   CO2 29   BUN 14.0   Creatinine 0.9   Calcium 9.7   Albumin 4.5   Protein, Total 7.0   Bilirubin, Total 1.4*   Alkaline Phosphatase 60   ALT 29   AST (SGOT) 35   Glucose 89   TSH 0.83        Rads:   No results found.   No results found for this or any previous visit.     No results found for this or any previous visit.       GI Procedures:       Assessment/Plan:   1. Family history of stomach cancer   Happily bxes all negative without hp dysplasia or metaplasia. Current guidelines for screening are not definitive. Typically 3 yr egd recoimmended. Some investigators recommend q 2 yrs. He is avg risk for colon ca and hence repeat colonoscopy should be performed in 10 yrs    Plan: would repeat egd q 2-3 yrs repat colonoscopy in 10 yrs.     All of the above has been reviewed at length with this patient including differential dx's, rationale for laboratory test, medications, procedures  including all benefits and risks. Have reviewed results and current data re egd screening  with 1 first degree family member with gastric ca. vist.    Follow-up date to  be determined/adjusted as needed based on the diagnostic test/procedure results, therapeutic response, and symptoms.       It is a great pleasure to participate in the care of Edward Kelly.     Doreen Beam, MD

## 2021-11-04 ENCOUNTER — Telehealth (HOSPITAL_BASED_OUTPATIENT_CLINIC_OR_DEPARTMENT_OTHER): Payer: Self-pay

## 2021-11-04 ENCOUNTER — Ambulatory Visit: Admission: RE | Admit: 2021-11-04 | Payer: Self-pay | Source: Ambulatory Visit

## 2021-11-04 NOTE — Telephone Encounter (Signed)
Called [facility]. Lynford Humphrey states that order specifies 3D measurements, which [facility] does not do. She will call the pt and give FFX [facility] phone number, for pt to reschedule.

## 2021-11-04 NOTE — Telephone Encounter (Signed)
I have a lady on the line from [facility] calling in regards to the patient order for and Echo. The patient has an appt today 9/5 @ 4:15pm. Lynford Humphrey stated that the order should be a 3D Echo. Can you please change the order or reach out to shanda to let her know what study needs to be done.      Marcelle Smiling

## 2021-11-07 ENCOUNTER — Ambulatory Visit
Admission: RE | Admit: 2021-11-07 | Discharge: 2021-11-07 | Disposition: A | Discharge status: Home / Self Care | Payer: BLUE CROSS/BLUE SHIELD | Source: Ambulatory Visit | Attending: Internal Medicine | Admitting: Internal Medicine

## 2021-11-07 DIAGNOSIS — R011 Cardiac murmur, unspecified: Secondary | ICD-10-CM | POA: Insufficient documentation

## 2021-11-07 LAB — ECHO ADULT TTE COMPLETE
AV Area (Cont Eq VTI): 4.16
AV Area (Cont Eq VTI): 4.161
AV Mean Gradient: 3
AV Peak Velocity: 119
Ao Root Diameter (2D): 4.2
BP Mod LV Ejection Fraction: 60.763
IVS Diastolic Thickness (2D): 1.1
LA Dimension (2D): 3.6
LA Volume Index (BP A-L): 0.024
LVID diastole (2D): 5.5
LVID systole (2D): 3.2
MV Area (PHT): 2.625
MV E/A: 1.991
MV E/A: 2
MV E/e' (Average): 6.456
Mitral Valve Findings: NORMAL
Prox Ascending Aorta Diameter: 3.5
Pulmonary Valve Findings: NORMAL
RV Basal Diastolic Dimension: 4.8
RV Function: NORMAL
RV Systolic Pressure: 21
RV Systolic Pressure: 28.612
TAPSE: 2.63
Tricuspid Valve Findings: NORMAL

## 2021-11-24 ENCOUNTER — Telehealth (HOSPITAL_BASED_OUTPATIENT_CLINIC_OR_DEPARTMENT_OTHER): Payer: Self-pay | Admitting: Internal Medicine

## 2021-11-24 NOTE — Telephone Encounter (Signed)
Patient called in asking for a call back from his 3D Echo he had done 11-07-2021. He can be reached at [phone].    Domon[place]  [facility]

## 2021-12-10 ENCOUNTER — Telehealth (HOSPITAL_BASED_OUTPATIENT_CLINIC_OR_DEPARTMENT_OTHER): Payer: Self-pay

## 2021-12-10 ENCOUNTER — Encounter (HOSPITAL_BASED_OUTPATIENT_CLINIC_OR_DEPARTMENT_OTHER): Payer: Self-pay

## 2021-12-10 ENCOUNTER — Ambulatory Visit (HOSPITAL_BASED_OUTPATIENT_CLINIC_OR_DEPARTMENT_OTHER): Payer: BLUE CROSS/BLUE SHIELD

## 2021-12-10 VITALS — BP 115/72 | HR 65 | Ht 72.0 in | Wt 200.0 lb

## 2021-12-10 DIAGNOSIS — I517 Cardiomegaly: Secondary | ICD-10-CM

## 2021-12-10 NOTE — Progress Notes (Signed)
[facility] OFFICE VISIT      REASON FOR OFFICE VISIT:  Echo follow-up    HISTORY OF PRESENT ILLNESS:  I had the pleasure of seeing Edward Kelly today for cardiovascular evaluation. He is a pleasant 41 y.o. male who presents for a follow-up visit to discuss test results of his repeat echocardiogram with #3D RV assessment to assess mildly elevated RV.  He remains active and exercises with weights and running regularly.  He denies chest pain or shortness of breath.      Cardiac history:  Borderline hyperlipidemia  Heart Murmur    PAST MEDICAL HISTORY: He has a past medical history of Hyperlipidemia and Murmur, cardiac. He has a past surgical history that includes APPENDECTOMY (OPEN); Colonoscopy (N/A, 08/12/2021); and EGD, BIOPSY (N/A, 08/12/2021).    MEDICATIONS: No current outpatient medications on file.     ALLERGIES: No Known Allergies      REVIEW OF SYSTEMS: All other systems reviewed and negative except as above.    PHYSICAL EXAMINATION    Vital Signs: BP 115/72 (BP Site: Right arm, Patient Position: Sitting, Cuff Size: Large)   Pulse 65   Ht 1.829 m (6')   Wt 90.7 kg (200 lb)   SpO2 97%   BMI 27.12 kg/m      General Appearance: A well-appearing male in no acute distress.   HEENT: Sclera anicteric, conjunctiva without pallor.  Neck: Supple without jugular venous distention.    Neuro: Alert and oriented x3. Grossly intact. Normal mood and affect.     Echocardiogram 3D 11/07/2021  Left ventricular systolic function is normal with an ejection fraction by  Biplane Method of Discs of  61 %.    * Left ventricular diastolic filling parameters demonstrate normal diastolic function.    * The right ventricular cavity size is mildly dilated.    * Normal right ventricular systolic function.    * Three dimensionally derived right ventricular ejection fraction is 49% (normal).    * Right ventricle free wall longitudinal strain is -31.7%, normal.    * The right atrium is dilated.    * No pulmonary  hypertension with estimated right ventricular systolic pressure of  29 mmHg.    * The aortic root is mildly dilated at 4.2 cm in diameter.    * Compared to the prior study dated 08/05/2021, the aortic root is measured at 4.2cm on today's study. Otherwise, 3D and strain information is new.    Echocardiogram 08/06/2021  Summary    * The left ventricle is normal in size.    * There is normal left ventricular geometry.    * Left ventricular systolic function is normal with an ejection fraction by  Biplane Method of Discs of  67 %.    * Left ventricular segmental wall motion is normal.    * Left ventricular diastolic filling parameters demonstrate normal diastolic function.    * The right ventricular cavity size is mildly dilated.    * Normal right ventricular systolic function.    * RV basal diameter =  4.19 cm.    * TAPSE = 2.3 cm, S' = 15 cm/s, fractional area change = 50 %.    * There is trace to mild mitral regurgitation.    * There is trace tricuspid regurgitation.    * No pulmonary hypertension with estimated right ventricular systolic pressure of  14 mmHg.    * The IVC is normal in size with > 50% respiratory variance consistent  with normal RA pressure of 3 mmHg.    * No pericardial effusion visualized.    * No prior study is available for comparison.    LABS:   Lab Results   Component Value Date    WBC 4.46 06/03/2021    HGB 15.7 06/03/2021    HCT 45.5 06/03/2021    PLT 224 06/03/2021    NA 140 06/03/2021    K 4.5 06/03/2021    BUN 14.0 06/03/2021    CREAT 0.9 06/03/2021    GLU 89 06/03/2021    CHOL 177 06/03/2021    TRIG 43 06/03/2021    HDL 61 06/03/2021    LDL 107 (H) 06/03/2021    AST 35 06/03/2021    ALT 29 06/03/2021    HGBA1C 4.9 06/03/2021    TSH 0.83 06/03/2021        IMPRESSION/RECOMMENDATIONS: Edward Kelly is a 41 y.o. male who presents today to discuss results of his repeat echocardiogram with 3D.  He remains active and denies chest pain, shortness of breath, palpitations, or fatigue.    Mildly dilated  RV, confirmed with 3D echo. EKG (07/18/2021) is normal.  Right ventricular ejection fraction is normal (49%).  No pHTN. Right ventricle free wall longitudinal strain -31.7% (normal). Normal EST with no arrhythmias.   Right atrium is dilated.    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  Blood pressure <120/80 without medication.    Cardiac MRI to r/o RV dysplasia.   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  Then echocardiogram annually.  Follow up with Dr. Ginger Organ post cardiac MRI.      Thank you for allowing our team to care of Edward Kelly.  Please contact us if you have any questions.    Ames Dura, FNP-BC  [facility] [place]

## 2021-12-12 ENCOUNTER — Telehealth (INDEPENDENT_AMBULATORY_CARE_PROVIDER_SITE_OTHER): Payer: Self-pay | Admitting: Internal Medicine

## 2021-12-12 NOTE — Patient Instructions (Signed)
Cardiac MRI to r/o RV dysplasia.   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  Then echocardiogram annually.  Follow up with Dr. Ginger Organ post cardiac MRI.

## 2021-12-12 NOTE — Telephone Encounter (Signed)
PLACE PT WITH ARDESTANI IN NEXT AVAILABLE SLOT

## 2022-01-07 ENCOUNTER — Other Ambulatory Visit: Payer: Self-pay

## 2022-01-07 ENCOUNTER — Ambulatory Visit
Admission: RE | Admit: 2022-01-07 | Discharge: 2022-01-07 | Disposition: A | Discharge status: Home / Self Care | Payer: BLUE CROSS/BLUE SHIELD | Source: Ambulatory Visit

## 2022-01-07 DIAGNOSIS — I517 Cardiomegaly: Secondary | ICD-10-CM | POA: Insufficient documentation

## 2022-01-07 MED ORDER — GADOBUTROL 1 MMOL/ML IV SOSY (WRAP)
20.0000 mL | Freq: Once | INTRAVENOUS | Status: AC | PRN
Start: 2022-01-07 — End: 2022-01-07
  Administered 2022-01-07: 20 mL via INTRAVENOUS
  Filled 2022-01-07: qty 20

## 2022-01-26 ENCOUNTER — Encounter (INDEPENDENT_AMBULATORY_CARE_PROVIDER_SITE_OTHER): Payer: Self-pay | Admitting: Family
